# Patient Record
Sex: Female | Born: 1985 | Race: White | Hispanic: No | Marital: Married
Health system: Midwestern US, Community
[De-identification: ages and names within clinical notes are randomized; demographics above are authoritative.]

## PROBLEM LIST (undated history)

## (undated) DIAGNOSIS — R011 Cardiac murmur, unspecified: Secondary | ICD-10-CM

## (undated) HISTORY — PX: LAPAROSCOPY FOR ECTOPIC PREGNANCY: SUR765

## (undated) HISTORY — PX: TYMPANOSTOMY TUBE PLACEMENT: SHX32

---

## 2007-10-21 NOTE — Op Note (Signed)
[facility]                                OPERATION REPORT                         SURGEON:  Cyndie Chime, M.D.   [facility] [place], [place]   E:   MR  [id number]                DATE OF SURGERY:                     10/21/2007   #:   Lindley Magnus  [id number]             PT. LOCATION:                        OR  OR08   #   DERWIN Hubert Azure, M.D.         DOB: 07/04/1986        AGE:22        SEX:  F   cc:    Cyndie Chime, M.D.   PREOPERATIVE DIAGNOSIS:   Right ectopic pregnancy.   POSTOPERATIVE DIAGNOSIS:   Intraluminal rupture of a right tubal pregnancy.   PROCEDURE:   Laparoscopic right partial salpingectomy.   SURGEON:   Cyndie Chime, M.D.   SURGICAL ASSISTANT:   SA x 1   ANESTHESIA:General.   FINDINGS:   That of a normal uterus, normal tube and ovary on the left.  Normal ovary   on the right.  There was an ampullary intraluminal rupture of the right   tubal pregnancy with about 100 cc of hemoperitoneum.   DESCRIPTION OF OPERATIVE TECHNIQUE:  The patient was taken to the operating   room, placed on the operative table in the supine position and general   anesthesia was administered.  When an adequate level of anesthesia had been   reached, she was prepped and draped in the usual sterile manner.  A Cohen   cannula was placed in the cervix.  A 19-gauge Veress needle was introduced   in the umbilicus and 4 liters of CO2 were insufflated.  The short trocar   was introduced in the abdomen and the laparoscope was then inserted.  Once   inside, the findings consistent with the above were noted.  A 5 mm trocar   was placed in the suprapubic region under direct visualization and a 12 mm   trocar was placed midway between the umbilicus and the pubic bone.  The   EndoGIA was fired across the mesosalpinx involving the ectopic pregnancy   and the specimen was sent for pathological evaluation.  This was brought   out in a specimen bag through a 12 mm trocar insertion site.  The incision   was reinforced with vascular clips.   Assured of adequate hemostasis, the   CO2 was allowed to escape from the abdomen.  0.25% marcaine was then   injected into the subcutaneous tissues and a 4-0 Monocryl closed the skin   in subcuticular fashion.  The patient was then awakened and taken to   recovery room in stable condition having tolerated the procedure well.   Electronically Signed By:   Cyndie Chime, M.D. 11/14/2007 13:44   ____________________________   Cyndie Chime, M.D.   ECC  D:  10/21/2007  T:  10/24/2007  8:43 A   [id number]

## 2010-08-24 LAB — D-DIMER, QUANTITATIVE: D-Dimer, Quant: <0.22 ug/ml(FEU) (ref <0.46)

## 2010-08-25 NOTE — Procedures (Signed)
Test Reason : Dizziness and giddiness   Blood Pressure : ***/*** mmHG   Vent. Rate : 084 BPM     Atrial Rate : 084 BPM      P-R Int : 180 ms          QRS Dur : 100 ms       QT Int : 360 ms       P-R-T Axes : 059 067 052 degrees      QTc Int : 425 ms   Normal sinus rhythm   Incomplete right bundle branch block   Borderline ECG   No previous ECGs available   Confirmed by Ramon Dredge ([id number]) on 08/26/2010 7:09:12 AM   Referred By:             Gay Filler By: Ramon Dredge

## 2011-03-03 LAB — HIV-1 AB WESTERN BLOT CONFIRM ONLY: HIV, EXTERNAL: NEGATIVE

## 2011-03-03 LAB — RPR: RPR, EXTERNAL: NONREACTIVE

## 2011-03-03 LAB — BLOOD TYPE, (ABO+RH)

## 2011-07-07 LAB — TYPE AND SCREEN
ABO/Rh: B POS
Antibody Screen: NEGATIVE

## 2011-07-08 NOTE — Procedures (Signed)
Epidural Catheter Placement    Operator: Guss Bunde, MD      Risks, benefits, and alternatives discussed. Time out performed, correct patient and site identified. Standard monitors applied.  Adequate sedation provided. Patient placed in sitting position, back prepped and draped with Betadine. 1% lidocaine skin wheal, 17g Tuohy needle placed at L3-4    level. + LOR obtained with air/nss, no heme or CSF noted. Epidural catheter placed via Tuohy needle and secured at 11   cm at skin, no paresthesias. Negative test dose with 1.5% lidocaine w/1:200,000 epinephrine. Catheter dosed with Fentanyl 100 mcg and 0.2% Ropiv 12 ml.  No apparent complications.  See nurses notes for VS. Infusion started by RN

## 2011-07-08 NOTE — Procedures (Signed)
Epidural Catheter Placement    Operator: Guss Bunde, MD      Risks, benefits, and alternatives discussed. Time out performed, correct patient and site identified. Standard monitors applied.  Adequate sedation provided. Patient placed in sitting position, back prepped and draped with Betadine. 1% lidocaine skin wheal, 17g Tuohy needle placed at L3-4  level. + LOR obtained with air/nss, no heme or CSF noted. Epidural catheter placed via Tuohy needle and secured at 9 cm at skin, no paresthesias. Negative test dose with 3 mLs 1.5% lidocaine w/1:200,000 epinephrine. Catheter dosed with Fentanyl 100 mcg and 0.2% Ropiv 12 ml.  No apparent complications.  See nurses notes for VS. Infusion started by RN   Epidural Catheter Placement    Operator: Guss Bunde, MD      Risks, benefits, and alternatives discussed. Time out performed, correct patient and site identified. Standard monitors applied.  Adequate sedation provided. Patient placed in sitting position, back prepped and draped with Betadine. 1% lidocaine skin wheal, 17g Tuohy needle placed at L3-4  level. + LOR obtained with air/nss, no heme or CSF noted. Epidural catheter placed via Tuohy needle and secured at 9   cm at skin, no paresthesias. Negative test dose with 3 mLs 1.5% lidocaine w/1:200,000 epinephrine. Catheter dosed with Fentanyl 100 mcg and 0.2% Ropiv 12 ml.  No apparent complications.  See nurses notes for VS. Infusion started by RN

## 2014-04-14 NOTE — ED Notes (Signed)
Formatting of this note might be different from the original.  Pt left via wheelchair to US.  Electronically signed by Felton ClintonHoven, Joyce S at 04/14/2014  2:41 PM EDT

## 2014-04-14 NOTE — ED Notes (Signed)
Formatting of this note might be different from the original.  I have reviewed discharge instructions with the patient.  The patient verbalized understanding.  Patient received three presription(s). Patient told not to drive with medication. Patient was ambulatory upon discharge. Patient was in stable condition. Patient was accompanied with family member. Patient armband removed and shredded    Electronically signed by Felton Clinton at 04/14/2014  3:59 PM EDT

## 2014-04-14 NOTE — ED Notes (Signed)
Formatting of this note might be different from the original.  Pt ambulated to the bathroom. Gait steady. Urine obtained and sent to the lab.  Electronically signed by Felton Clinton at 04/14/2014  1:44 PM EDT

## 2014-04-14 NOTE — ED Notes (Signed)
Formatting of this note might be different from the original.  Pt reports abd pain x1 week along with nausea. Abd pain radiates to the back, worse during intercourse or when trunk twisting. Denies hematuria/dysutia/pyuria.   Electronically signed by Felton Clinton at 04/14/2014  1:42 PM EDT

## 2014-04-14 NOTE — ED Notes (Signed)
Formatting of this note might be different from the original.  Lower right abdominal pain and diarrhea x 1 week. Getting progressively worse. Abnormal period 7/26. Sharp pain with certain movement  Electronically signed by Loura Halt, RN at 04/14/2014  1:22 PM EDT

## 2014-04-14 NOTE — ED Provider Notes (Signed)
Associated Order(s): PELVIC EXAM  (ASAP ONLY)  Formatting of this note is different from the original.  HPI Comments: Michelle Mcintyre is a 28 year old white female who presents to the ER this date with a c/o right pelvic pain for the past week.   Pt rates her pain as a 8:10 and adds that its constant.  She has no Hx of STD or kidney stones.  She has had nausea without emesis.      Patient is a 28 y.o. female presenting with abdominal pain. The history is provided by the patient. History limited by: no communication barrier.   Abdominal Pain   This is a new problem. The current episode started more than 2 days ago (Pt states she's had this pain for a week. ). The problem occurs constantly. The problem has not changed since onset.Pain location: Right Pelvic area. The quality of the pain is cramping and aching. The pain is at a severity of 8/10. Associated symptoms include nausea. Pertinent negatives include no vomiting. The pain is worsened by activity. The pain is relieved by nothing.       Past Medical History   Diagnosis Date   ? Heart abnormalities        Past Surgical History   Procedure Laterality Date   ? Hx abdominal laparoscopy  2/09       History reviewed. No pertinent family history.     History     Social History   ? Marital Status: SINGLE     Spouse Name: N/A     Number of Children: N/A   ? Years of Education: N/A     Occupational History   ? Not on file.     Social History Main Topics   ? Smoking status: Former Smoker -- 1.00 packs/day for 7 years   ? Smokeless tobacco: Never Used   ? Alcohol Use: No   ? Drug Use: No   ? Sexual Activity:     Partners: Male     Birth Control/ Protection: Implant     Other Topics Concern   ? Not on file     Social History Narrative         ALLERGIES: Review of patient's allergies indicates no known allergies.    Review of Systems   Constitutional: Negative.    HENT: Negative.    Eyes: Negative.    Respiratory: Negative.    Cardiovascular: Negative.    Gastrointestinal:  Positive for nausea and abdominal pain. Negative for vomiting.   Endocrine: Negative.    Genitourinary: Positive for pelvic pain.   Musculoskeletal: Negative.    Skin: Negative.    Allergic/Immunologic: Negative.    Neurological: Negative.    Hematological: Negative.    Psychiatric/Behavioral: Negative.      Filed Vitals:    04/14/14 1324   BP: 132/89   Pulse: 89   Temp: 98.8 F (37.1 C)   Resp: 16   Height: 5\' 6"  (1.676 m)   Weight: 63.504 kg (140 lb)   SpO2: 100%       Physical Exam   Constitutional: She is oriented to person, place, and time. She appears well-developed and well-nourished. No distress.   HENT:   Head: Normocephalic and atraumatic.   Eyes: EOM are normal. Pupils are equal, round, and reactive to light.   Neck: Normal range of motion. Neck supple.   Cardiovascular: Normal rate, regular rhythm and normal heart sounds.    Pulmonary/Chest: Effort normal and breath sounds  normal. No respiratory distress. She has no wheezes. She has no rales.   Abdominal: Soft. Bowel sounds are normal. She exhibits no distension. There is no tenderness. There is no rebound and no guarding.   Genitourinary:   See procedure note    Musculoskeletal: Normal range of motion.   Neurological: She is alert and oriented to person, place, and time. No cranial nerve deficit. She exhibits normal muscle tone. Coordination normal.   Skin: Skin is warm and dry.   Psychiatric: She has a normal mood and affect.   Nursing note and vitals reviewed.      MDM  Number of Diagnoses or Management Options  BV (bacterial vaginosis):   Cyst of right ovary:   Diagnosis management comments: DDX:  Ovaria Cyst, Ovarian Presgnancy, Ovarian Torsion.  Katherine Roan, NP  3:48 PM      Amount and/or Complexity of Data Reviewed  Clinical lab tests: ordered and reviewed  Tests in the radiology section of CPT: ordered and reviewed    Risk of Complications, Morbidity, and/or Mortality  Presenting problems: moderate  Diagnostic procedures: moderate  Management  options: moderate    Patient Progress  Patient progress: stable    Pelvic Exam  Date/Time: 04/14/2014 2:42 PM  Performed by: NP  Procedure duration:  4 minutes.  Documented by:  Seward Meth NP-C.   Exam assisted by:  Sheralyn Boatman  RN.  Type of exam performed: speculum.    External genitalia appearance: normal.    Vaginal exam:  normal.    Cervical exam:  discharge from cervix.    Specimen(s) collected:  chlamydia, GC and vaginal culture.  Bimanual exam:  normal.    Patient tolerance: Patient tolerated the procedure well with no immediate complications    Diagnosis:   1. Cyst of right ovary    2. BV (bacterial vaginosis)      Disposition:   Discharged to home.      Follow-up Information    Follow up With Details Comments Contact Info    Newman Pies, MD  on Monday to obtain a follow up appointment.   [address]  [address]   [facility]  [place] [address]  [phone]        Current Discharge Medication List     START taking these medications    Details   oxyCODONE-acetaminophen (PERCOCET) 5-325 mg per tablet Take 1 Tab by mouth every six (6) hours as needed for Pain. Max Daily Amount: 4 Tabs.  Qty: 20 Tab, Refills: 0     ibuprofen (MOTRIN) 600 mg tablet Take 1 Tab by mouth every six (6) hours as needed for Pain.  Qty: 20 Tab, Refills: 0     metroNIDAZOLE (FLAGYL) 500 mg tablet Take 1 Tab by mouth two (2) times a day for 10 days.  Qty: 20 Tab, Refills: 0           Electronically signed by Sherrian Divers, MD at 04/14/2014  4:44 PM EDT    Associated attestation - Sherrian Divers, MD - 04/14/2014  4:44 PM EDT  Formatting of this note might be different from the original.  Patient with pelvic pain c/w ovarian cyst without evidence of STD, PID, torsion, UTI or pregnancy. Patient understands that an early or occult intraabdominal or pelvic fracture cannot be ruled out and will return immediately for any worsening or failure to respond to symptomatic treatment over the next 1-2 days. Precautions given.    I have  personally  seen and evaluated patient. I find the patient's history and physical exam are consistent with the PA's NP documentation. I agree with the care provided, treatments rendered, disposition and follow up plan.

## 2016-05-25 ENCOUNTER — Emergency Department (HOSPITAL_COMMUNITY)
Admission: EM | Admit: 2016-05-25 | Discharge: 2016-05-25 | Disposition: A | Discharge status: Home / Self Care | Payer: Self-pay | Attending: Emergency Medicine | Admitting: Emergency Medicine

## 2016-05-25 ENCOUNTER — Emergency Department (HOSPITAL_COMMUNITY): Payer: Self-pay

## 2016-05-25 ENCOUNTER — Encounter (HOSPITAL_COMMUNITY): Payer: Self-pay | Admitting: Emergency Medicine

## 2016-05-25 DIAGNOSIS — N760 Acute vaginitis: Secondary | ICD-10-CM | POA: Insufficient documentation

## 2016-05-25 DIAGNOSIS — Z791 Long term (current) use of non-steroidal anti-inflammatories (NSAID): Secondary | ICD-10-CM | POA: Insufficient documentation

## 2016-05-25 DIAGNOSIS — Z79899 Other long term (current) drug therapy: Secondary | ICD-10-CM | POA: Insufficient documentation

## 2016-05-25 DIAGNOSIS — Z113 Encounter for screening for infections with a predominantly sexual mode of transmission: Secondary | ICD-10-CM | POA: Insufficient documentation

## 2016-05-25 DIAGNOSIS — R1031 Right lower quadrant pain: Secondary | ICD-10-CM

## 2016-05-25 DIAGNOSIS — Z711 Person with feared health complaint in whom no diagnosis is made: Secondary | ICD-10-CM

## 2016-05-25 DIAGNOSIS — B9689 Other specified bacterial agents as the cause of diseases classified elsewhere: Secondary | ICD-10-CM

## 2016-05-25 DIAGNOSIS — N83201 Unspecified ovarian cyst, right side: Secondary | ICD-10-CM

## 2016-05-25 LAB — CBC WITH DIFFERENTIAL/PLATELET
BASOS ABS: 0 10*3/uL (ref 0.0–0.1)
BASOS PCT: 0 %
EOS PCT: 0 %
Eosinophils Absolute: 0 10*3/uL (ref 0.0–0.7)
HEMATOCRIT: 39.2 % (ref 36.0–46.0)
Hemoglobin: 12.8 g/dL (ref 12.0–15.0)
LYMPHS PCT: 25 %
Lymphs Abs: 2.2 10*3/uL (ref 0.7–4.0)
MCH: 27.9 pg (ref 26.0–34.0)
MCHC: 32.7 g/dL (ref 30.0–36.0)
MCV: 85.6 fL (ref 78.0–100.0)
MONO ABS: 0.6 10*3/uL (ref 0.1–1.0)
Monocytes Relative: 7 %
NEUTROS ABS: 5.9 10*3/uL (ref 1.7–7.7)
NEUTROS PCT: 68 %
PLATELETS: 213 10*3/uL (ref 150–400)
RBC: 4.58 MIL/uL (ref 3.87–5.11)
RDW: 13.9 % (ref 11.5–15.5)
WBC: 8.7 10*3/uL (ref 4.0–10.5)

## 2016-05-25 LAB — COMPREHENSIVE METABOLIC PANEL
ALBUMIN: 3.9 g/dL (ref 3.5–5.0)
ALK PHOS: 57 U/L (ref 38–126)
ALT: 13 U/L — ABNORMAL LOW (ref 14–54)
ANION GAP: 5 (ref 5–15)
AST: 21 U/L (ref 15–41)
BUN: 11 mg/dL (ref 6–20)
CALCIUM: 9.5 mg/dL (ref 8.9–10.3)
CO2: 29 mmol/L (ref 22–32)
Chloride: 104 mmol/L (ref 101–111)
Creatinine, Ser: 0.71 mg/dL (ref 0.44–1.00)
GFR calc non Af Amer: >60 mL/min (ref >60)
GLUCOSE: 90 mg/dL (ref 65–99)
POTASSIUM: 4.1 mmol/L (ref 3.5–5.1)
SODIUM: 138 mmol/L (ref 135–145)
Total Bilirubin: 0.5 mg/dL (ref 0.3–1.2)
Total Protein: 7.1 g/dL (ref 6.5–8.1)

## 2016-05-25 LAB — POC URINE PREG, ED: Preg Test, Ur: NEGATIVE

## 2016-05-25 LAB — URINALYSIS, ROUTINE W REFLEX MICROSCOPIC
BILIRUBIN URINE: NEGATIVE
Glucose, UA: NEGATIVE mg/dL
Hgb urine dipstick: NEGATIVE
KETONES UR: NEGATIVE mg/dL
Leukocytes, UA: NEGATIVE
NITRITE: NEGATIVE
Protein, ur: NEGATIVE mg/dL
Specific Gravity, Urine: 1.021 (ref 1.005–1.030)
pH: 6.5 (ref 5.0–8.0)

## 2016-05-25 LAB — WET PREP, GENITAL
Sperm: NONE SEEN
TRICH WET PREP: NONE SEEN
YEAST WET PREP: NONE SEEN

## 2016-05-25 MED ORDER — NAPROXEN 500 MG PO TABS: 500.0000 mg | ORAL_TABLET | Freq: Two times a day (BID) | ORAL | 0 refills | Status: DC

## 2016-05-25 MED ORDER — AZITHROMYCIN 250 MG PO TABS
1000.0000 mg | ORAL_TABLET | Freq: Once | ORAL | Status: AC
  Administered 2016-05-25: 1000 mg via ORAL
  Filled 2016-05-25: qty 4

## 2016-05-25 MED ORDER — LIDOCAINE HCL 1 % IJ SOLN
INTRAMUSCULAR | Status: AC
  Administered 2016-05-25: 20 mL
  Filled 2016-05-25: qty 20

## 2016-05-25 MED ORDER — CEFTRIAXONE SODIUM 250 MG IJ SOLR
250.0000 mg | Freq: Once | INTRAMUSCULAR | Status: AC
  Administered 2016-05-25: 250 mg via INTRAMUSCULAR
  Filled 2016-05-25: qty 250

## 2016-05-25 MED ORDER — METRONIDAZOLE 500 MG PO TABS: 500.0000 mg | ORAL_TABLET | Freq: Two times a day (BID) | ORAL | 0 refills | Status: DC

## 2016-05-25 MED ORDER — ACETAMINOPHEN 325 MG PO TABS
650.0000 mg | ORAL_TABLET | Freq: Once | ORAL | Status: AC
  Administered 2016-05-25: 650 mg via ORAL
  Filled 2016-05-25: qty 2

## 2016-05-25 NOTE — ED Notes (Signed)
Patient transported to US 

## 2016-05-25 NOTE — ED Provider Notes (Signed)
[facility]-EMERGENCY DEPT Provider Note   CSN: [id number] Arrival date & time: 05/25/16  1054     History   Chief Complaint Chief Complaint  Patient presents with  . Abdominal Pain  . Back Pain    HPI Andrea Riddle is a 30 y.o. female.  Andrea Riddle is a 30 y.o. Female who presents to the emergency department complaining of cramping right lower quadrant abdominal pain for the past 5 days. She reports gradual onset about 5 days ago. She reports her pain is cramping and is worse with palpation. She reports it feels like where her ovary is. She reports seeing some ibuprofen early this morning with some relief. She denies any nausea, vomiting or diarrhea. She does report some slight vaginal discharge. She is sexually active and does not use protection. Last menstrual cycle was 04/28/2016. Previous abdominal surgical history includes a salpingectomy on her left side after an ectopic pregnancy. Patient denies fevers, nausea, vomiting, diarrhea, dysuria, urinary symptoms, upper abdominal pain, coughing or rashes.    The history is provided by the patient. No language interpreter was used.  Abdominal Pain   Pertinent negatives include fever, diarrhea, nausea, vomiting, dysuria, frequency and headaches.  Back Pain   Associated symptoms include abdominal pain and pelvic pain. Pertinent negatives include no chest pain, no fever, no headaches and no dysuria.    History reviewed. No pertinent past medical history.  There are no active problems to display for this patient.   History reviewed. No pertinent surgical history.  OB History    No data available       Home Medications    Prior to Admission medications   Medication Sig Start Date End Date Taking? Authorizing Provider  Acetaminophen-Aspirin Buffered (EXCEDRIN BACK & BODY) 250-250 MG tablet Take 2 tablets by mouth every 4 (four) hours as needed for fever or pain.   Yes Historical Provider, MD  ibuprofen (ADVIL,MOTRIN) 800 MG tablet  Take 800 mg by mouth every 6 (six) hours as needed for fever, headache, mild pain, moderate pain or cramping.   Yes Historical Provider, MD  Melatonin 5 MG TABS Take 10 mg by mouth at bedtime.   Yes Historical Provider, MD  metroNIDAZOLE (FLAGYL) 500 MG tablet Take 1 tablet (500 mg total) by mouth 2 (two) times daily. 05/25/16   Everlene Farrier, PA-C  naproxen (NAPROSYN) 500 MG tablet Take 1 tablet (500 mg total) by mouth 2 (two) times daily with a meal. 05/25/16   Everlene Farrier, PA-C    Family History No family history on file.  Social History Social History  Substance Use Topics  . Smoking status: Never Smoker  . Smokeless tobacco: Not on file  . Alcohol use No     Allergies   Review of patient's allergies indicates no known allergies.   Review of Systems Review of Systems  Constitutional: Negative for chills and fever.  HENT: Negative for congestion and sore throat.   Eyes: Negative for visual disturbance.  Respiratory: Negative for cough and shortness of breath.   Cardiovascular: Negative for chest pain.  Gastrointestinal: Positive for abdominal pain. Negative for blood in stool, diarrhea, nausea and vomiting.  Genitourinary: Positive for pelvic pain and vaginal discharge. Negative for difficulty urinating, dysuria, frequency, genital sores, urgency and vaginal bleeding.  Musculoskeletal: Negative for neck pain.  Skin: Negative for rash.  Neurological: Negative for headaches.     Physical Exam Updated Vital Signs BP 142/92 (BP Location: Left Arm)   Pulse 76   Temp 98.2  F (36.8 C) (Oral)   Resp 16   Ht 5\' 6"  (1.676 m)   Wt 88.9 kg   LMP 04/28/2016   SpO2 99%   BMI 31.64 kg/m   Physical Exam  Constitutional: She appears well-developed and well-nourished. No distress.  Nontoxic appearing.  HENT:  Head: Normocephalic and atraumatic.  Mouth/Throat: Oropharynx is clear and moist.  Eyes: Conjunctivae are normal. Pupils are equal, round, and reactive to light.  Right eye exhibits no discharge. Left eye exhibits no discharge.  Neck: Neck supple.  Cardiovascular: Normal rate, regular rhythm, normal heart sounds and intact distal pulses.   Pulmonary/Chest: Effort normal and breath sounds normal. No respiratory distress. She has no wheezes. She has no rales.  Abdominal: Soft. Bowel sounds are normal. She exhibits no distension and no mass. There is tenderness. There is no rebound and no guarding.  Abdomen is soft. Bowel sounds are present. Patient has right adnexal tenderness or palpation. No rebound tenderness. No psoas or obturator sign. No CVA or flank tenderness.  Musculoskeletal: She exhibits no edema.  Lymphadenopathy:    She has no cervical adenopathy.  Neurological: She is alert. Coordination normal.  Skin: Skin is warm and dry. Capillary refill takes less than 2 seconds. No rash noted. She is not diaphoretic. No erythema. No pallor.  Psychiatric: She has a normal mood and affect. Her behavior is normal.  Nursing note and vitals reviewed.    ED Treatments / Results  Labs (all labs ordered are listed, but only abnormal results are displayed) Labs Reviewed  WET PREP, GENITAL - Abnormal; Notable for the following:       Result Value   Clue Cells Wet Prep HPF POC PRESENT (*)    WBC, Wet Prep HPF POC MANY (*)    All other components within normal limits  COMPREHENSIVE METABOLIC PANEL - Abnormal; Notable for the following:    ALT 13 (*)    All other components within normal limits  URINALYSIS, ROUTINE W REFLEX MICROSCOPIC (NOT AT [facility]) - Abnormal; Notable for the following:    APPearance CLOUDY (*)    All other components within normal limits  CBC WITH DIFFERENTIAL/PLATELET  POC URINE PREG, ED  GC/CHLAMYDIA PROBE AMP ([place]) NOT AT [facility]    EKG  EKG Interpretation None       Radiology US Transvaginal Non-ob  Result Date: 05/25/2016 CLINICAL DATA:  Right pelvic pain for 2 weeks.  No known injury. EXAM: TRANSABDOMINAL AND  TRANSVAGINAL ULTRASOUND OF PELVIS DOPPLER ULTRASOUND OF OVARIES TECHNIQUE: Both transabdominal and transvaginal ultrasound examinations of the pelvis were performed. Transabdominal technique was performed for global imaging of the pelvis including uterus, ovaries, adnexal regions, and pelvic cul-de-sac. It was necessary to proceed with endovaginal exam following the transabdominal exam to visualize the ovaries. Color and duplex Doppler ultrasound was utilized to evaluate blood flow to the ovaries. COMPARISON:  None. FINDINGS: Uterus Measurements: 8.6 x 4.5 x 4.8 cm. No fibroids or other mass visualized. Endometrium Thickness: 17 mm.  No focal abnormality visualized. Right ovary Measurements: 4.1 x 2.5 x 3.2 cm. Small cystic lesion with internal echoes measures 2.4 x 1.8 x 2.0 cm and has an appearance most consistent with a hemorrhagic cyst. Left ovary Measurements: 3.0 x 1.6 x 2.2 cm. Normal appearance/no adnexal mass. Pulsed Doppler evaluation of both ovaries demonstrates normal low-resistance arterial and venous waveforms. Other findings Trace amount of free pelvic fluid noted. IMPRESSION: Small hemorrhagic right ovarian cyst. The examination is otherwise negative. Electronically Signed  By: Drusilla Kanner M.D.   On: 05/25/2016 14:44   US Pelvis Complete  Result Date: 05/25/2016 CLINICAL DATA:  Right pelvic pain for 2 weeks.  No known injury. EXAM: TRANSABDOMINAL AND TRANSVAGINAL ULTRASOUND OF PELVIS DOPPLER ULTRASOUND OF OVARIES TECHNIQUE: Both transabdominal and transvaginal ultrasound examinations of the pelvis were performed. Transabdominal technique was performed for global imaging of the pelvis including uterus, ovaries, adnexal regions, and pelvic cul-de-sac. It was necessary to proceed with endovaginal exam following the transabdominal exam to visualize the ovaries. Color and duplex Doppler ultrasound was utilized to evaluate blood flow to the ovaries. COMPARISON:  None. FINDINGS: Uterus  Measurements: 8.6 x 4.5 x 4.8 cm. No fibroids or other mass visualized. Endometrium Thickness: 17 mm.  No focal abnormality visualized. Right ovary Measurements: 4.1 x 2.5 x 3.2 cm. Small cystic lesion with internal echoes measures 2.4 x 1.8 x 2.0 cm and has an appearance most consistent with a hemorrhagic cyst. Left ovary Measurements: 3.0 x 1.6 x 2.2 cm. Normal appearance/no adnexal mass. Pulsed Doppler evaluation of both ovaries demonstrates normal low-resistance arterial and venous waveforms. Other findings Trace amount of free pelvic fluid noted. IMPRESSION: Small hemorrhagic right ovarian cyst. The examination is otherwise negative. Electronically Signed   By: Drusilla Kanner M.D.   On: 05/25/2016 14:44   [place] Art/ven Flow Abd Pelv Doppler  Result Date: 05/25/2016 CLINICAL DATA:  Right pelvic pain for 2 weeks.  No known injury. EXAM: TRANSABDOMINAL AND TRANSVAGINAL ULTRASOUND OF PELVIS DOPPLER ULTRASOUND OF OVARIES TECHNIQUE: Both transabdominal and transvaginal ultrasound examinations of the pelvis were performed. Transabdominal technique was performed for global imaging of the pelvis including uterus, ovaries, adnexal regions, and pelvic cul-de-sac. It was necessary to proceed with endovaginal exam following the transabdominal exam to visualize the ovaries. Color and duplex Doppler ultrasound was utilized to evaluate blood flow to the ovaries. COMPARISON:  None. FINDINGS: Uterus Measurements: 8.6 x 4.5 x 4.8 cm. No fibroids or other mass visualized. Endometrium Thickness: 17 mm.  No focal abnormality visualized. Right ovary Measurements: 4.1 x 2.5 x 3.2 cm. Small cystic lesion with internal echoes measures 2.4 x 1.8 x 2.0 cm and has an appearance most consistent with a hemorrhagic cyst. Left ovary Measurements: 3.0 x 1.6 x 2.2 cm. Normal appearance/no adnexal mass. Pulsed Doppler evaluation of both ovaries demonstrates normal low-resistance arterial and venous waveforms. Other findings Trace amount of  free pelvic fluid noted. IMPRESSION: Small hemorrhagic right ovarian cyst. The examination is otherwise negative. Electronically Signed   By: Drusilla Kanner M.D.   On: 05/25/2016 14:44    Procedures Procedures (including critical care time)  Medications Ordered in ED Medications  acetaminophen (TYLENOL) tablet 650 mg (650 mg Oral Given 05/25/16 1346)  cefTRIAXone (ROCEPHIN) injection 250 mg (250 mg Intramuscular Given 05/25/16 1506)  azithromycin (ZITHROMAX) tablet 1,000 mg (1,000 mg Oral Given 05/25/16 1506)  lidocaine (XYLOCAINE) 1 % (with pres) injection (20 mLs  Given 05/25/16 1506)     Initial Impression / Assessment and Plan / ED Course  I have reviewed the triage vital signs and the nursing notes.  Pertinent labs & imaging results that were available during my care of the patient were reviewed by me and considered in my medical decision making (see chart for details).  Clinical Course   Patient presented to the emergency department complaining of right lower quadrant abdominal pain and vaginal discharge for 5 days. She reports gradual onset of pain. No fevers. No urinary symptoms. On exam the patient is afebrile  nontoxic-appearing. She has right adnexal tenderness on exam. On pelvic exam she has white vaginal discharge and right adnexal tenderness. No cervical motion tenderness. Cervix is closed. No vaginal bleeding. CMP and CBC are unremarkable. Pregnancy test is negative. Urinalysis showed no sign of infection. Wet prep is positive for clue cells and many white blood cells. Pelvic ultrasound was obtained. It shows a right hemorrhagic cyst. No evidence of torsion. Ultrasound is otherwise unremarkable. Recheck patient reports she is feeling better. She is tolerating by mouth. I advised her of these results. We'll treat her for gonorrhea and chlamydia with Rocephin and azithromycin. I advised that these tests are pending and she should follow-up in 3 days for test results. We'll also  discharge her with naproxen for pain control. I encouraged her to follow-up with the health department or with her OB/GYN for routine STD check in 1 week. I discussed return precautions. I advised the patient to follow-up with their primary care provider this week. I advised the patient to return to the emergency department with new or worsening symptoms or new concerns. The patient verbalized understanding and agreement with plan.    Final Clinical Impressions(s) / ED Diagnoses   Final diagnoses:  RLQ abdominal pain  Right ovarian cyst  Concern about STD in female without diagnosis  BV (bacterial vaginosis)    New Prescriptions New Prescriptions   METRONIDAZOLE (FLAGYL) 500 MG TABLET    Take 1 tablet (500 mg total) by mouth 2 (two) times daily.   NAPROXEN (NAPROSYN) 500 MG TABLET    Take 1 tablet (500 mg total) by mouth 2 (two) times daily with a meal.     Everlene FarrierWilliam Donyea Gafford, PA-C 05/25/16 1514    Gwyneth SproutWhitney Plunkett, MD 05/29/16 [phone]

## 2016-05-25 NOTE — ED Triage Notes (Signed)
Pt c/o lower right sided abdominal pain, pt describes it as "cramping feeling in my ovaries and lower back, I get bacterial vaginosis a lot." Pain onset about 5 days ago last took 800 mg ibuprofen about 6 am today. Denies any burning while voiding, denies nausea or vomiting.

## 2016-05-26 LAB — GC/CHLAMYDIA PROBE AMP (~~LOC~~) NOT AT ARMC
CHLAMYDIA, DNA PROBE: NEGATIVE
NEISSERIA GONORRHEA: NEGATIVE

## 2016-11-19 ENCOUNTER — Encounter (HOSPITAL_COMMUNITY): Payer: Self-pay | Admitting: Emergency Medicine

## 2016-11-19 ENCOUNTER — Emergency Department (HOSPITAL_COMMUNITY)
Admission: EM | Admit: 2016-11-19 | Discharge: 2016-11-19 | Disposition: A | Discharge status: Home / Self Care | Payer: Self-pay | Attending: Emergency Medicine | Admitting: Emergency Medicine

## 2016-11-19 DIAGNOSIS — Z202 Contact with and (suspected) exposure to infections with a predominantly sexual mode of transmission: Secondary | ICD-10-CM

## 2016-11-19 DIAGNOSIS — N760 Acute vaginitis: Secondary | ICD-10-CM | POA: Insufficient documentation

## 2016-11-19 DIAGNOSIS — Z791 Long term (current) use of non-steroidal anti-inflammatories (NSAID): Secondary | ICD-10-CM | POA: Insufficient documentation

## 2016-11-19 DIAGNOSIS — B9689 Other specified bacterial agents as the cause of diseases classified elsewhere: Secondary | ICD-10-CM

## 2016-11-19 DIAGNOSIS — Z79899 Other long term (current) drug therapy: Secondary | ICD-10-CM | POA: Insufficient documentation

## 2016-11-19 LAB — URINALYSIS, ROUTINE W REFLEX MICROSCOPIC
Bilirubin Urine: NEGATIVE
Glucose, UA: NEGATIVE mg/dL
Hgb urine dipstick: NEGATIVE
Ketones, ur: NEGATIVE mg/dL
LEUKOCYTES UA: NEGATIVE
NITRITE: NEGATIVE
PROTEIN: NEGATIVE mg/dL
SPECIFIC GRAVITY, URINE: 1.01 (ref 1.005–1.030)
pH: 6 (ref 5.0–8.0)

## 2016-11-19 LAB — WET PREP, GENITAL
Sperm: NONE SEEN
Trich, Wet Prep: NONE SEEN
Yeast Wet Prep HPF POC: NONE SEEN

## 2016-11-19 LAB — PREGNANCY, URINE: PREG TEST UR: NEGATIVE

## 2016-11-19 MED ORDER — LIDOCAINE HCL (PF) 1 % IJ SOLN
INTRAMUSCULAR | Status: AC
  Filled 2016-11-19: qty 5

## 2016-11-19 MED ORDER — CEFTRIAXONE SODIUM 250 MG IJ SOLR
250.0000 mg | Freq: Once | INTRAMUSCULAR | Status: AC
Start: 2016-11-19 — End: 2016-11-19
  Administered 2016-11-19: 250 mg via INTRAMUSCULAR
  Filled 2016-11-19: qty 250

## 2016-11-19 MED ORDER — METRONIDAZOLE 500 MG PO TABS: 500.0000 mg | ORAL_TABLET | Freq: Two times a day (BID) | ORAL | 0 refills | Status: DC

## 2016-11-19 MED ORDER — AZITHROMYCIN 250 MG PO TABS
1000.0000 mg | ORAL_TABLET | Freq: Once | ORAL | Status: AC
  Administered 2016-11-19: 1000 mg via ORAL
  Filled 2016-11-19: qty 4

## 2016-11-19 NOTE — Discharge Instructions (Signed)
Medications: Flagyl  Treatment: Take Flagyl twice daily for 1 week. Make sure not to drink alcohol with this medication. You will be called in 2-3 days if any of your results return positive. You have are being treated for gonorrhea and Chlamydia in the emergency department today. Make sure to tell all of your sexual partners of any positive results that can be treated as well.  Follow-up: You can follow-up with the health department in the future for STD testing. Please return to emergency department if he develop any new or worsening symptoms. Please follow-up and establish care with a primary care provider by calling the number circled on your discharge paperwork.

## 2016-11-19 NOTE — ED Triage Notes (Signed)
PT c/o vaginal odor with some discharge but no pain unrelieved by OTC yeast infection medications. PT denies any urinary symptoms.

## 2016-11-19 NOTE — ED Provider Notes (Signed)
[facility]-EMERGENCY DEPT Provider Note   CSN: [id number] Arrival date & time: 11/19/16  1101     History   Chief Complaint Chief Complaint  Patient presents with  . Vaginal Discharge    HPI Andrea Riddle is a 31 y.o. female with history of bacterial vaginosis who presents with a four-day history of malodorous vaginal discharge. Patient reports change in sexual partners and is concerned about STD exposure. Patient has had some mild associated intermittent nausea, but not significant. Patient has taken over-the-counter medication for yeast infection without relief. Patient denies any urinary symptoms, abnormal bleeding, pelvic pain, abdominal pain, vomiting, fevers. Patient LMP 11/07/2016.  HPI  History reviewed. No pertinent past medical history.  There are no active problems to display for this patient.   Past Surgical History:  Procedure Laterality Date  . LAPAROSCOPY FOR ECTOPIC PREGNANCY    . TYMPANOSTOMY TUBE PLACEMENT      OB History    Gravida Para Term Preterm AB Living   4       1 3    SAB TAB Ectopic Multiple Live Births       1           Home Medications    Prior to Admission medications   Medication Sig Start Date End Date Taking? Authorizing Provider  Acetaminophen-Aspirin Buffered (EXCEDRIN BACK & BODY) 250-250 MG tablet Take 2 tablets by mouth every 4 (four) hours as needed for fever or pain.    Historical Provider, MD  ibuprofen (ADVIL,MOTRIN) 800 MG tablet Take 800 mg by mouth every 6 (six) hours as needed for fever, headache, mild pain, moderate pain or cramping.    Historical Provider, MD  Melatonin 5 MG TABS Take 10 mg by mouth at bedtime.    Historical Provider, MD  metroNIDAZOLE (FLAGYL) 500 MG tablet Take 1 tablet (500 mg total) by mouth 2 (two) times daily. 11/19/16   Emi HolesAlexandra M Vester Balthazor, PA-C  naproxen (NAPROSYN) 500 MG tablet Take 1 tablet (500 mg total) by mouth 2 (two) times daily with a meal. 05/25/16   Everlene FarrierWilliam Dansie, PA-C    Family History History  reviewed. No pertinent family history.  Social History Social History  Substance Use Topics  . Smoking status: Never Smoker  . Smokeless tobacco: Never Used  . Alcohol use No     Allergies   Patient has no known allergies.   Review of Systems Review of Systems  Constitutional: Negative for fever.  Gastrointestinal: Positive for nausea. Negative for diarrhea and vomiting.  Genitourinary: Positive for vaginal discharge. Negative for dysuria, frequency, menstrual problem, pelvic pain, urgency, vaginal bleeding and vaginal pain.  Skin: Negative for rash and wound.     Physical Exam Updated Vital Signs BP 123/80 (BP Location: Right Arm)   Pulse 60   Temp 98.2 F (36.8 C) (Oral)   Resp 18   Ht 5\' 6"  (1.676 m)   Wt 68 kg   LMP 11/07/2016   SpO2 100%   BMI 24.21 kg/m   Physical Exam  Constitutional: She appears well-developed and well-nourished. No distress.  HENT:  Head: Normocephalic and atraumatic.  Mouth/Throat: Oropharynx is clear and moist. No oropharyngeal exudate.  Eyes: Conjunctivae are normal. Pupils are equal, round, and reactive to light. Right eye exhibits no discharge. Left eye exhibits no discharge. No scleral icterus.  Neck: Normal range of motion. Neck supple. No thyromegaly present.  Cardiovascular: Normal rate, regular rhythm, normal heart sounds and intact distal pulses.  Exam reveals no gallop and no  friction rub.   No murmur heard. Pulmonary/Chest: Effort normal and breath sounds normal. No stridor. No respiratory distress. She has no wheezes. She has no rales.  Abdominal: Soft. Bowel sounds are normal. She exhibits no distension. There is no tenderness. There is no rebound and no guarding.  Genitourinary: Uterus normal. Pelvic exam was performed with patient supine. Cervix exhibits discharge (white, milky). Cervix exhibits no motion tenderness. Right adnexum displays no mass, no tenderness and no fullness. Left adnexum displays no mass, no tenderness  and no fullness. No bleeding in the vagina. Vaginal discharge found.  Genitourinary Comments: Chaperone present  Musculoskeletal: She exhibits no edema.  Lymphadenopathy:    She has no cervical adenopathy.  Neurological: She is alert. Coordination normal.  Skin: Skin is warm and dry. No rash noted. She is not diaphoretic. No pallor.  Psychiatric: She has a normal mood and affect.  Nursing note and vitals reviewed.    ED Treatments / Results  Labs (all labs ordered are listed, but only abnormal results are displayed) Labs Reviewed  WET PREP, GENITAL - Abnormal; Notable for the following:       Result Value   Clue Cells Wet Prep HPF POC PRESENT (*)    WBC, Wet Prep HPF POC MANY (*)    All other components within normal limits  PREGNANCY, URINE  URINALYSIS, ROUTINE W REFLEX MICROSCOPIC  GC/CHLAMYDIA PROBE AMP ([place]) NOT AT [facility]    EKG  EKG Interpretation None       Radiology No results found.  Procedures Procedures (including critical care time)  Medications Ordered in ED Medications  lidocaine (PF) (XYLOCAINE) 1 % injection (not administered)  cefTRIAXone (ROCEPHIN) injection 250 mg (250 mg Intramuscular Given 11/19/16 1408)  azithromycin (ZITHROMAX) tablet 1,000 mg (1,000 mg Oral Given 11/19/16 1406)     Initial Impression / Assessment and Plan / ED Course  I have reviewed the triage vital signs and the nursing notes.  Pertinent labs & imaging results that were available during my care of the patient were reviewed by me and considered in my medical decision making (see chart for details).     Patient with vaginal discharge, no concern for PID. Patient treated in the ED for STI with Rocephin, azithromycin. Discharge home with Flagyl for bacterial vaginosis. Patient advised not to drink alcohol with this medication. Patient advised to inform and treat all sexual partners.  Pt advised on safe sex practices and understands that they have GC/Chlamydia cultures  pending and will result in 2-3 days. Patient declined HIV and RPR at this time. UA negative. Urine pregnancy negative. Pt encouraged to follow up at local health department for future STI checks. Discussed return precautions. Patient understands and agrees with plan. Patient vitals stable throughout ED course and discharged in satisfactory condition.   Final Clinical Impressions(s) / ED Diagnoses   Final diagnoses:  BV (bacterial vaginosis)  Possible exposure to STD    New Prescriptions Discharge Medication List as of 11/19/2016  2:30 PM       Emi Holes, PA-C 11/19/16 1736    Vanetta Mulders, MD 11/21/16 [phone]

## 2016-11-23 LAB — GC/CHLAMYDIA PROBE AMP (~~LOC~~) NOT AT ARMC
Chlamydia: NEGATIVE
Neisseria Gonorrhea: NEGATIVE

## 2017-01-26 ENCOUNTER — Emergency Department (HOSPITAL_COMMUNITY)
Admission: EM | Admit: 2017-01-26 | Discharge: 2017-01-26 | Disposition: A | Discharge status: Home / Self Care | Payer: Self-pay | Attending: Emergency Medicine | Admitting: Emergency Medicine

## 2017-01-26 ENCOUNTER — Encounter (HOSPITAL_COMMUNITY): Payer: Self-pay | Admitting: *Deleted

## 2017-01-26 ENCOUNTER — Emergency Department (HOSPITAL_COMMUNITY): Payer: Self-pay

## 2017-01-26 DIAGNOSIS — S060X1A Concussion with loss of consciousness of 30 minutes or less, initial encounter: Secondary | ICD-10-CM | POA: Insufficient documentation

## 2017-01-26 DIAGNOSIS — M542 Cervicalgia: Secondary | ICD-10-CM | POA: Insufficient documentation

## 2017-01-26 DIAGNOSIS — S0990XA Unspecified injury of head, initial encounter: Secondary | ICD-10-CM

## 2017-01-26 DIAGNOSIS — M25512 Pain in left shoulder: Secondary | ICD-10-CM | POA: Insufficient documentation

## 2017-01-26 DIAGNOSIS — Y999 Unspecified external cause status: Secondary | ICD-10-CM | POA: Insufficient documentation

## 2017-01-26 DIAGNOSIS — Y939 Activity, unspecified: Secondary | ICD-10-CM | POA: Insufficient documentation

## 2017-01-26 DIAGNOSIS — Y92009 Unspecified place in unspecified non-institutional (private) residence as the place of occurrence of the external cause: Secondary | ICD-10-CM | POA: Insufficient documentation

## 2017-01-26 HISTORY — DX: Cardiac murmur, unspecified: R01.1

## 2017-01-26 MED ORDER — IBUPROFEN 800 MG PO TABS
800.0000 mg | ORAL_TABLET | Freq: Once | ORAL | Status: AC
  Administered 2017-01-26: 800 mg via ORAL
  Filled 2017-01-26: qty 1

## 2017-01-26 MED ORDER — IBUPROFEN 800 MG PO TABS: 800.0000 mg | ORAL_TABLET | Freq: Three times a day (TID) | ORAL | 0 refills | Status: AC

## 2017-01-26 MED ORDER — METHOCARBAMOL 500 MG PO TABS: 500.0000 mg | ORAL_TABLET | Freq: Two times a day (BID) | ORAL | 0 refills | Status: AC

## 2017-01-26 NOTE — ED Notes (Signed)
Pt ambulatory to waiting room. Pt verbalized understanding of discharge instructions.   
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
## 2017-01-26 NOTE — Discharge Instructions (Signed)
Your xrays look normal. You can return if your symptoms worsen.
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
## 2017-06-15 ENCOUNTER — Encounter (HOSPITAL_COMMUNITY): Payer: Self-pay | Admitting: Emergency Medicine

## 2017-06-15 ENCOUNTER — Emergency Department (HOSPITAL_COMMUNITY)
Admission: EM | Admit: 2017-06-15 | Discharge: 2017-06-15 | Disposition: A | Discharge status: Home / Self Care | Payer: Self-pay | Attending: Emergency Medicine | Admitting: Emergency Medicine

## 2017-06-15 DIAGNOSIS — M545 Low back pain: Secondary | ICD-10-CM | POA: Insufficient documentation

## 2017-06-15 DIAGNOSIS — R109 Unspecified abdominal pain: Secondary | ICD-10-CM | POA: Insufficient documentation

## 2017-06-15 DIAGNOSIS — Z5321 Procedure and treatment not carried out due to patient leaving prior to being seen by health care provider: Secondary | ICD-10-CM | POA: Insufficient documentation

## 2017-06-15 DIAGNOSIS — N898 Other specified noninflammatory disorders of vagina: Secondary | ICD-10-CM | POA: Insufficient documentation

## 2017-06-15 LAB — CBC
HEMATOCRIT: 35 % — AB (ref 36.0–46.0)
HEMOGLOBIN: 11.8 g/dL — AB (ref 12.0–15.0)
MCH: 29 pg (ref 26.0–34.0)
MCHC: 33.7 g/dL (ref 30.0–36.0)
MCV: 86 fL (ref 78.0–100.0)
Platelets: 192 10*3/uL (ref 150–400)
RBC: 4.07 MIL/uL (ref 3.87–5.11)
RDW: 13.2 % (ref 11.5–15.5)
WBC: 9.4 10*3/uL (ref 4.0–10.5)

## 2017-06-15 LAB — COMPREHENSIVE METABOLIC PANEL
ALBUMIN: 4.2 g/dL (ref 3.5–5.0)
ALK PHOS: 55 U/L (ref 38–126)
ALT: 13 U/L — ABNORMAL LOW (ref 14–54)
ANION GAP: 8 (ref 5–15)
AST: 20 U/L (ref 15–41)
BUN: 12 mg/dL (ref 6–20)
CHLORIDE: 105 mmol/L (ref 101–111)
CO2: 26 mmol/L (ref 22–32)
Calcium: 9.5 mg/dL (ref 8.9–10.3)
Creatinine, Ser: 0.77 mg/dL (ref 0.44–1.00)
GFR calc Af Amer: >60 mL/min (ref >60)
GFR calc non Af Amer: >60 mL/min (ref >60)
GLUCOSE: 88 mg/dL (ref 65–99)
POTASSIUM: 3.7 mmol/L (ref 3.5–5.1)
SODIUM: 139 mmol/L (ref 135–145)
Total Bilirubin: 0.5 mg/dL (ref 0.3–1.2)
Total Protein: 7.2 g/dL (ref 6.5–8.1)

## 2017-06-15 LAB — LIPASE, BLOOD: LIPASE: 29 U/L (ref 11–51)

## 2017-06-15 LAB — I-STAT BETA HCG BLOOD, ED (MC, WL, AP ONLY): I-stat hCG, quantitative: <5 m[IU]/mL (ref <5)

## 2017-06-15 NOTE — ED Triage Notes (Signed)
Patient c/o lower abd and lowe back pain with vaginal discharge with "sweaty smell" and irregular bleeding between periods over the past week patient denies any urinary problems. Patient tried taking Monstat OTC for yeast infection week ago but still having symptoms.

## 2017-06-15 NOTE — ED Triage Notes (Signed)
Pt called from triage with no answer 
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
## 2017-06-16 ENCOUNTER — Emergency Department (HOSPITAL_COMMUNITY)
Admission: EM | Admit: 2017-06-16 | Discharge: 2017-06-17 | Disposition: A | Discharge status: Home / Self Care | Payer: Self-pay | Attending: Emergency Medicine | Admitting: Emergency Medicine

## 2017-06-16 ENCOUNTER — Encounter (HOSPITAL_COMMUNITY): Payer: Self-pay

## 2017-06-16 DIAGNOSIS — Z202 Contact with and (suspected) exposure to infections with a predominantly sexual mode of transmission: Secondary | ICD-10-CM | POA: Insufficient documentation

## 2017-06-16 DIAGNOSIS — N898 Other specified noninflammatory disorders of vagina: Secondary | ICD-10-CM | POA: Insufficient documentation

## 2017-06-16 DIAGNOSIS — A599 Trichomoniasis, unspecified: Secondary | ICD-10-CM | POA: Insufficient documentation

## 2017-06-16 LAB — URINALYSIS, ROUTINE W REFLEX MICROSCOPIC
Bilirubin Urine: NEGATIVE
GLUCOSE, UA: NEGATIVE mg/dL
Ketones, ur: NEGATIVE mg/dL
Nitrite: NEGATIVE
PH: 5 (ref 5.0–8.0)
Protein, ur: NEGATIVE mg/dL
Specific Gravity, Urine: 1.013 (ref 1.005–1.030)

## 2017-06-16 LAB — POC URINE PREG, ED: Preg Test, Ur: NEGATIVE

## 2017-06-16 NOTE — ED Triage Notes (Signed)
Pt complains of abdominal pain with cramping and discharge She states that her discharge is watery and has a musky odor

## 2017-06-16 NOTE — ED Provider Notes (Signed)
[facility]-EMERGENCY DEPT Provider Note   CSN: [id number] Arrival date & time: 06/16/17  1811     History   Chief Complaint Chief Complaint  Patient presents with  . Abdominal Pain    HPI Andrea Riddle is a 31 y.o. female.  The history is provided by the patient and medical records.   31 y.o. F presenting to the ED for lower abdominal pain.  States this is been ongoing for about a week. States she has a lot of cramping in her lower abdomen. She reports associated thin, watery vaginal discharge and some menstrual spotting.  Has had some urinary frequency but denies dysuria. No flank pain.  No fever or chills. She has newly separated from her husband, unsure if he had any STD as she was concerned for some infidelity at the end of their marriage.  No hx of STD in the past.  Not currently followed by GYN.  Past Medical History:  Diagnosis Date  . Heart murmur     There are no active problems to display for this patient.   Past Surgical History:  Procedure Laterality Date  . LAPAROSCOPY FOR ECTOPIC PREGNANCY    . TYMPANOSTOMY TUBE PLACEMENT      OB History    Gravida Para Term Preterm AB Living   SAB TAB Ectopic Multiple Live Births       1           Home Medications    Prior to Admission medications   Medication Sig Start Date End Date Taking? Authorizing Provider  ibuprofen (ADVIL,MOTRIN) 800 MG tablet Take 1 tablet (800 mg total) by mouth 3 (three) times daily. 01/26/17   Maczis, Elmer Sow, PA-C  methocarbamol (ROBAXIN) 500 MG tablet Take 1 tablet (500 mg total) by mouth 2 (two) times daily. 01/26/17   Maczis, Elmer Sow, PA-C    Family History History reviewed. No pertinent family history.  Social History Social History  Substance Use Topics  . Smoking status: Never Smoker  . Smokeless tobacco: Never Used  . Alcohol use No     Allergies   Patient has no known allergies.   Review of Systems Review of Systems  Gastrointestinal: Positive for  abdominal pain.  Genitourinary: Positive for vaginal discharge.  All other systems reviewed and are negative.    Physical Exam Updated Vital Signs BP 120/70 (BP Location: Left Arm)   Pulse 65   Temp 99 F (37.2 C) (Oral)   Resp 18   Ht  (1.676 m)   Wt 69.1 kg (152 lb 4.8 oz)   LMP 05/24/2017   SpO2 100%   BMI 24.58 kg/m   Physical Exam  Constitutional: She is oriented to person, place, and time. She appears well-developed and well-nourished.  HENT:  Head: Normocephalic and atraumatic.  Mouth/Throat: Oropharynx is clear and moist.  Eyes: Pupils are equal, round, and reactive to light. Conjunctivae and EOM are normal.  Neck: Normal range of motion.  Cardiovascular: Normal rate, regular rhythm and normal heart sounds.   Pulmonary/Chest: Effort normal and breath sounds normal. No respiratory distress. She has no wheezes.  Abdominal: Soft. Bowel sounds are normal. There is no tenderness. There is no rebound and no CVA tenderness.  Genitourinary:  Genitourinary Comments: Exam chaperoned by RN Normal female external genitalia without visible lesions or rash; large amount of thin, watery discharge in the vaginal vault, no bleeding; no adnexal or CMT  Musculoskeletal: Normal  range of motion.  Neurological: She is alert and oriented to person, place, and time.  Skin: Skin is warm and dry.  Psychiatric: She has a normal mood and affect.  Nursing note and vitals reviewed.    ED Treatments / Results  Labs (all labs ordered are listed, but only abnormal results are displayed) Labs Reviewed  WET PREP, GENITAL - Abnormal; Notable for the following:       Result Value   Trich, Wet Prep PRESENT (*)    WBC, Wet Prep HPF POC MANY (*)    All other components within normal limits  URINALYSIS, ROUTINE W REFLEX MICROSCOPIC - Abnormal; Notable for the following:    APPearance CLOUDY (*)    Hgb urine dipstick SMALL (*)    Leukocytes, UA LARGE (*)    Bacteria, UA RARE (*)    Squamous  Epithelial / LPF 6-30 (*)    All other components within normal limits  POC URINE PREG, ED  GC/CHLAMYDIA PROBE AMP ([place]) NOT AT [facility]    EKG  EKG Interpretation None       Radiology No results found.  Procedures Procedures (including critical care time)  Medications Ordered in ED Medications - No data to display   Initial Impression / Assessment and Plan / ED Course  I have reviewed the triage vital signs and the nursing notes.  Pertinent labs & imaging results that were available during my care of the patient were reviewed by me and considered in my medical decision making (see chart for details).  31 year old female here with lower abdominal cramping and vaginal discharge. Recently separated from her husband, does express some concern for STD given his infidelities. She is afebrile and nontoxic. Abdominal exam is benign.  Pelvic exam performed, large amount of thin, watery vaginal discharge noted. There is no adnexal or cervical motion tenderness. UA with WBCs, rare bacteria. Wet prep with trichomoniasis noted.  Gc/chl pending.  Given findings on wet prep, will treat empirically here.  She will be notified if other cultures results are positive.  Can follow-up with women's clinic if any ongoing issues.  Discussed plan with patient, she acknowledged understanding and agreed with plan of care.  Return precautions given for new or worsening symptoms.  Final Clinical Impressions(s) / ED Diagnoses   Final diagnoses:  Vaginal discharge  Trichomonas infection    New Prescriptions Discharge Medication List as of 06/17/2017  1:11 AM       Garlon Hatchet, PA-C 06/17/17 0127    Arby Barrette, MD 06/17/17 2340

## 2017-06-16 NOTE — ED Triage Notes (Signed)
Pt called from triage with no answer 

## 2017-06-17 LAB — WET PREP, GENITAL
Clue Cells Wet Prep HPF POC: NONE SEEN
SPERM: NONE SEEN
YEAST WET PREP: NONE SEEN

## 2017-06-17 MED ORDER — AZITHROMYCIN 250 MG PO TABS
1000.0000 mg | ORAL_TABLET | Freq: Once | ORAL | Status: AC
  Administered 2017-06-17: 1000 mg via ORAL
  Filled 2017-06-17: qty 4

## 2017-06-17 MED ORDER — STERILE WATER FOR INJECTION IJ SOLN
INTRAMUSCULAR | Status: AC
  Administered 2017-06-17: 2.1 mL
  Filled 2017-06-17: qty 10

## 2017-06-17 MED ORDER — CEFTRIAXONE SODIUM 250 MG IJ SOLR
250.0000 mg | Freq: Once | INTRAMUSCULAR | Status: AC
  Administered 2017-06-17: 250 mg via INTRAMUSCULAR
  Filled 2017-06-17: qty 250

## 2017-06-17 MED ORDER — METRONIDAZOLE 500 MG PO TABS
2000.0000 mg | ORAL_TABLET | Freq: Once | ORAL | Status: AC
  Administered 2017-06-17: 2000 mg via ORAL
  Filled 2017-06-17: qty 4

## 2017-06-17 NOTE — Discharge Instructions (Signed)
You will be contacted in the next 48-72 hours if your other culture results are positive but you have already been treated.  These results will also be uploaded into mychart. Abstain from sexual activity for at least 7-10 days to let medications take effect. Can follow-up with the women's clinic for any ongoing issues-- can call to make  appt, they also have walk-in clinic. Return here for any new/worsening symptoms.

## 2017-06-18 LAB — GC/CHLAMYDIA PROBE AMP (~~LOC~~) NOT AT ARMC
CHLAMYDIA, DNA PROBE: NEGATIVE
NEISSERIA GONORRHEA: NEGATIVE

## 2018-11-01 ENCOUNTER — Emergency Department (HOSPITAL_BASED_OUTPATIENT_CLINIC_OR_DEPARTMENT_OTHER)
Admission: EM | Admit: 2018-11-01 | Discharge: 2018-11-01 | Disposition: A | Discharge status: Home / Self Care | Payer: Self-pay | Attending: Emergency Medicine | Admitting: Emergency Medicine

## 2018-11-01 ENCOUNTER — Other Ambulatory Visit: Payer: Self-pay

## 2018-11-01 ENCOUNTER — Encounter (HOSPITAL_BASED_OUTPATIENT_CLINIC_OR_DEPARTMENT_OTHER): Payer: Self-pay

## 2018-11-01 DIAGNOSIS — R350 Frequency of micturition: Secondary | ICD-10-CM | POA: Insufficient documentation

## 2018-11-01 DIAGNOSIS — N898 Other specified noninflammatory disorders of vagina: Secondary | ICD-10-CM | POA: Insufficient documentation

## 2018-11-01 DIAGNOSIS — F1210 Cannabis abuse, uncomplicated: Secondary | ICD-10-CM | POA: Insufficient documentation

## 2018-11-01 LAB — URINALYSIS, ROUTINE W REFLEX MICROSCOPIC
BILIRUBIN URINE: NEGATIVE
Glucose, UA: NEGATIVE mg/dL
Hgb urine dipstick: NEGATIVE
Ketones, ur: NEGATIVE mg/dL
Leukocytes,Ua: NEGATIVE
NITRITE: NEGATIVE
PROTEIN: NEGATIVE mg/dL
Specific Gravity, Urine: 1.01 (ref 1.005–1.030)
pH: 6 (ref 5.0–8.0)

## 2018-11-01 LAB — PREGNANCY, URINE: PREG TEST UR: NEGATIVE

## 2018-11-01 NOTE — ED Provider Notes (Signed)
[facility] EMERGENCY DEPARTMENT Provider Note   CSN: [id number] Arrival date & time: 11/01/18  1836    History   Chief Complaint Chief Complaint  Patient presents with  . Urinary Frequency    HPI Andrea Riddle is a 33 y.o. female.     Patient is a 33 year old female with no past medical history presents the emergency department for urinary frequency.  Reports that she has had frequency of urination for the past 4 days.  Reports that this feels like urinary tract infection that she has had in the past.  Denies any pelvic pain, hematuria, nausea, vomiting, fever.  She is currently on her menstrual cycle and this is normal for her.  Reports that she had some white discharge prior to the urinary frequency.  She also denies urgency.  She is sexually active and looks like to have STD testing on her urine.  Declines pelvic.     Past Medical History:  Diagnosis Date  . Heart murmur     There are no active problems to display for this patient.   Past Surgical History:  Procedure Laterality Date  . LAPAROSCOPY FOR ECTOPIC PREGNANCY    . TYMPANOSTOMY TUBE PLACEMENT       OB History    Gravida  4   Para      Term      Preterm      AB  1   Living  3     SAB      TAB      Ectopic  1   Multiple      Live Births               Home Medications    Prior to Admission medications   Medication Sig Start Date End Date Taking? Authorizing Provider  ibuprofen (ADVIL,MOTRIN) 800 MG tablet Take 1 tablet (800 mg total) by mouth 3 (three) times daily. Patient not taking: Reported on 06/16/2017 01/26/17   Maczis, Elmer Sow, PA-C  methocarbamol (ROBAXIN) 500 MG tablet Take 1 tablet (500 mg total) by mouth 2 (two) times daily. Patient not taking: Reported on 06/16/2017 01/26/17   Maczis, Elmer Sow, PA-C    Family History No family history on file.  Social History Social History   Tobacco Use  . Smoking status: Never Smoker  . Smokeless tobacco: Never Used   Substance Use Topics  . Alcohol use: Yes    Comment: occ  . Drug use: Yes    Types: Marijuana     Allergies   Patient has no known allergies.   Review of Systems Review of Systems  Constitutional: Negative.   Eyes: Negative.   Respiratory: Negative for cough and shortness of breath.   Gastrointestinal: Negative for abdominal pain, diarrhea, nausea and vomiting.  Endocrine: Negative for polyuria.  Genitourinary: Positive for frequency, urgency and vaginal discharge. Negative for decreased urine volume, difficulty urinating, dysuria, flank pain, genital sores, hematuria, menstrual problem, pelvic pain, vaginal bleeding and vaginal pain.  Musculoskeletal: Negative for arthralgias, back pain, gait problem, myalgias and neck pain.  Skin: Negative for rash.  Neurological: Negative for dizziness and light-headedness.     Physical Exam Updated Vital Signs BP (!) 121/55 (BP Location: Left Arm)   Pulse 63   Temp 98.2 F (36.8 C) (Oral)   Resp 18   Ht 5\' 7"  (1.702 m)   Wt 68.5 kg   LMP 10/27/2018   SpO2 100%   BMI 23.65 kg/m   Physical Exam  Vitals signs and nursing note reviewed.  Constitutional:      Appearance: Normal appearance.  HENT:     Head: Normocephalic.     Mouth/Throat:     Pharynx: Oropharynx is clear.  Eyes:     Conjunctiva/sclera: Conjunctivae normal.  Cardiovascular:     Rate and Rhythm: Normal rate and regular rhythm.  Pulmonary:     Effort: Pulmonary effort is normal.  Abdominal:     General: Abdomen is flat. Bowel sounds are normal. There is no distension.     Tenderness: There is no abdominal tenderness. There is no right CVA tenderness, left CVA tenderness or guarding.  Skin:    General: Skin is dry.  Neurological:     Mental Status: She is alert.  Psychiatric:        Mood and Affect: Mood normal.      ED Treatments / Results  Labs (all labs ordered are listed, but only abnormal results are displayed) Labs Reviewed  URINE CULTURE    URINALYSIS, ROUTINE W REFLEX MICROSCOPIC  PREGNANCY, URINE  GC/CHLAMYDIA PROBE AMP ([place]) NOT AT [facility]    EKG None  Radiology No results found.  Procedures Procedures (including critical care time)  Medications Ordered in ED Medications - No data to display   Initial Impression / Assessment and Plan / ED Course  I have reviewed the triage vital signs and the nursing notes.  Pertinent labs & imaging results that were available during my care of the patient were reviewed by me and considered in my medical decision making (see chart for details).  Clinical Course as of Nov 01 2036  Tue Nov 01, 2018  2034 Urinalysis is negative, urine was culture. GC/chlamydia testing is pending.  Patient reports that she has my chart and will be able to access this at home.  She was advised that she needs to return to be treated if anything is positive.  I offered her Pyridium for her symptoms but stated no antibiotics since her urinalysis is clear.  Patient then stated "that is it?  That is all you are going to do for me?".  I offered her a full pelvic exam but she declined.  I do not see an indication for any lab work as her physical exam was negative and her symptoms are only urinary frequency, urgency and vaginal discharge.  She is not having any pelvic pain to warrant any imaging. I explained to the patient she should return if she has new or worsened symptoms.    [KM]    Clinical Course User Index [KM] Arlyn Dunning, PA-C       Based on review of vitals, medical screening exam, lab work and/or imaging, there does not appear to be an acute, emergent etiology for the patient's symptoms. Counseled pt on good return precautions and encouraged both PCP and ED follow-up as needed.    Clinical Impression: 1. Urinary frequency     Disposition: Discharge   This note was prepared with assistance of Dragon voice recognition software. Occasional wrong-word or sound-a-like substitutions  may have occurred due to the inherent limitations of voice recognition software.   Final Clinical Impressions(s) / ED Diagnoses   Final diagnoses:  Urinary frequency    ED Discharge Orders    None       Jeral Pinch 11/01/18 2038    Charlynne Pander, MD 11/01/18 [phone]

## 2018-11-01 NOTE — ED Triage Notes (Signed)
C/o urinary freq x 3 days-NAD-steady gait

## 2018-11-01 NOTE — ED Notes (Addendum)
Pt left prior to receiving discharge instructions from RN. Unable to complete discharge instructions/vitals nor obtain esignature for this reason.

## 2018-11-01 NOTE — Discharge Instructions (Signed)
Please check results on MyChart and return here or follow-up with your primary care doctor for treatment if anything is positive.

## 2018-11-01 NOTE — ED Notes (Signed)
Pt not in room.

## 2018-11-03 LAB — URINE CULTURE: Culture: NO GROWTH

## 2018-11-03 LAB — GC/CHLAMYDIA PROBE AMP (~~LOC~~) NOT AT ARMC
CHLAMYDIA, DNA PROBE: POSITIVE — AB
NEISSERIA GONORRHEA: NEGATIVE

## 2018-11-04 ENCOUNTER — Encounter (HOSPITAL_BASED_OUTPATIENT_CLINIC_OR_DEPARTMENT_OTHER): Payer: Self-pay | Admitting: *Deleted

## 2018-11-04 ENCOUNTER — Other Ambulatory Visit: Payer: Self-pay

## 2018-11-04 ENCOUNTER — Emergency Department (HOSPITAL_BASED_OUTPATIENT_CLINIC_OR_DEPARTMENT_OTHER)
Admission: EM | Admit: 2018-11-04 | Discharge: 2018-11-04 | Disposition: A | Discharge status: Home / Self Care | Payer: Self-pay | Attending: Emergency Medicine | Admitting: Emergency Medicine

## 2018-11-04 DIAGNOSIS — A5602 Chlamydial vulvovaginitis: Secondary | ICD-10-CM | POA: Insufficient documentation

## 2018-11-04 DIAGNOSIS — A749 Chlamydial infection, unspecified: Secondary | ICD-10-CM

## 2018-11-04 MED ORDER — AZITHROMYCIN 250 MG PO TABS
1000.0000 mg | ORAL_TABLET | Freq: Once | ORAL | Status: AC
  Administered 2018-11-04: 1000 mg via ORAL
  Filled 2018-11-04: qty 4

## 2018-11-04 MED ORDER — CEFTRIAXONE SODIUM 250 MG IJ SOLR
250.0000 mg | Freq: Once | INTRAMUSCULAR | Status: AC
  Administered 2018-11-04: 250 mg via INTRAMUSCULAR
  Filled 2018-11-04: qty 250

## 2018-11-04 NOTE — Discharge Instructions (Addendum)
Abstain from intercourse, your partner will need to go to the health department for free treatment. Abstain from intercourse for 10 days after treatment (this includes all forms of intercourse).

## 2018-11-04 NOTE — ED Triage Notes (Signed)
States she had STD testing and the test were positive. She was not treated at the time.

## 2018-11-04 NOTE — ED Provider Notes (Signed)
[facility] EMERGENCY DEPARTMENT Provider Note   CSN: [id number] Arrival date & time: 11/04/18  1131    History   Chief Complaint No chief complaint on file.   HPI Andrea Riddle is a 33 y.o. female.     33 year old female presents for treatment of chlamydia.  Patient was in the emergency room 3 days ago, contacted advised she needed treatment for chlamydia.  Patient denies any complaints today.  Reports prior history of chlamydia which was also treated at that time.  Patient is aware that her partners will need to be treated.     Past Medical History:  Diagnosis Date  . Heart murmur     There are no active problems to display for this patient.   Past Surgical History:  Procedure Laterality Date  . LAPAROSCOPY FOR ECTOPIC PREGNANCY    . TYMPANOSTOMY TUBE PLACEMENT       OB History    Gravida  4   Para      Term      Preterm      AB  1   Living  3     SAB      TAB      Ectopic  1   Multiple      Live Births               Home Medications    Prior to Admission medications   Medication Sig Start Date End Date Taking? Authorizing Provider  ibuprofen (ADVIL,MOTRIN) 800 MG tablet Take 1 tablet (800 mg total) by mouth 3 (three) times daily. Patient not taking: Reported on 06/16/2017 01/26/17   Maczis, Elmer Sow, PA-C  methocarbamol (ROBAXIN) 500 MG tablet Take 1 tablet (500 mg total) by mouth 2 (two) times daily. Patient not taking: Reported on 06/16/2017 01/26/17   Maczis, Elmer Sow, PA-C    Family History No family history on file.  Social History Social History   Tobacco Use  . Smoking status: Never Smoker  . Smokeless tobacco: Never Used  Substance Use Topics  . Alcohol use: Yes    Comment: occ  . Drug use: Yes    Types: Marijuana     Allergies   Patient has no known allergies.   Review of Systems Review of Systems  Constitutional: Negative for fever.  Gastrointestinal: Negative for abdominal pain, nausea and  vomiting.  Skin: Negative for rash and wound.  Allergic/Immunologic: Negative for immunocompromised state.     Physical Exam Updated Vital Signs BP (!) 122/91   Pulse 70   Temp 98.3 F (36.8 C) (Oral)   Resp 16   LMP 10/27/2018   SpO2 100%   Physical Exam Vitals signs and nursing note reviewed.  Constitutional:      General: She is not in acute distress.    Appearance: She is well-developed. She is not diaphoretic.  HENT:     Head: Normocephalic and atraumatic.  Cardiovascular:     Rate and Rhythm: Normal rate and regular rhythm.     Pulses: Normal pulses.     Heart sounds: Normal heart sounds.  Pulmonary:     Effort: Pulmonary effort is normal.  Abdominal:     General: Abdomen is flat.     Tenderness: There is no abdominal tenderness.  Skin:    General: Skin is warm and dry.  Neurological:     Mental Status: She is alert and oriented to person, place, and time.  Psychiatric:  Behavior: Behavior normal.      ED Treatments / Results  Labs (all labs ordered are listed, but only abnormal results are displayed) Labs Reviewed - No data to display  EKG None  Radiology No results found.  Procedures Procedures (including critical care time)  Medications Ordered in ED Medications  azithromycin (ZITHROMAX) tablet 1,000 mg (1,000 mg Oral Given 11/04/18 1152)  cefTRIAXone (ROCEPHIN) injection 250 mg (250 mg Intramuscular Given 11/04/18 1152)     Initial Impression / Assessment and Plan / ED Course  I have reviewed the triage vital signs and the nursing notes.  Pertinent labs & imaging results that were available during my care of the patient were reviewed by me and considered in my medical decision making (see chart for details).  Clinical Course as of Nov 04 1213  Fri Nov 04, 2018  [id number] 33 year old female presents for treatment of chlamydia.  Importance of all partners being treated and abstinence x10 days.  Patient verbalized understanding of discharge  instructions and plan.   [LM]    Clinical Course User Index [LM] Jeannie Fend, PA-C      Final Clinical Impressions(s) / ED Diagnoses   Final diagnoses:  Chlamydia    ED Discharge Orders    None       Jeannie Fend, PA-C 11/04/18 1215    Long, Arlyss Repress, MD 11/05/18 1253

## 2019-01-31 ENCOUNTER — Encounter (HOSPITAL_COMMUNITY): Payer: Self-pay | Admitting: Emergency Medicine

## 2019-01-31 ENCOUNTER — Emergency Department (HOSPITAL_COMMUNITY): Payer: Self-pay

## 2019-01-31 ENCOUNTER — Emergency Department (HOSPITAL_COMMUNITY)
Admission: EM | Admit: 2019-01-31 | Discharge: 2019-01-31 | Disposition: A | Discharge status: Home / Self Care | Payer: Self-pay | Attending: Emergency Medicine | Admitting: Emergency Medicine

## 2019-01-31 ENCOUNTER — Other Ambulatory Visit: Payer: Self-pay

## 2019-01-31 DIAGNOSIS — R1031 Right lower quadrant pain: Secondary | ICD-10-CM

## 2019-01-31 DIAGNOSIS — B9689 Other specified bacterial agents as the cause of diseases classified elsewhere: Secondary | ICD-10-CM | POA: Insufficient documentation

## 2019-01-31 DIAGNOSIS — N939 Abnormal uterine and vaginal bleeding, unspecified: Secondary | ICD-10-CM | POA: Insufficient documentation

## 2019-01-31 DIAGNOSIS — N760 Acute vaginitis: Secondary | ICD-10-CM | POA: Insufficient documentation

## 2019-01-31 DIAGNOSIS — R197 Diarrhea, unspecified: Secondary | ICD-10-CM | POA: Insufficient documentation

## 2019-01-31 LAB — CBC
HCT: 38.7 % (ref 36.0–46.0)
Hemoglobin: 11.9 g/dL — ABNORMAL LOW (ref 12.0–15.0)
MCH: 25.6 pg — ABNORMAL LOW (ref 26.0–34.0)
MCHC: 30.7 g/dL (ref 30.0–36.0)
MCV: 83.4 fL (ref 80.0–100.0)
Platelets: 233 10*3/uL (ref 150–400)
RBC: 4.64 MIL/uL (ref 3.87–5.11)
RDW: 15.1 % (ref 11.5–15.5)
WBC: 10 10*3/uL (ref 4.0–10.5)
nRBC: 0 % (ref 0.0–0.2)

## 2019-01-31 LAB — COMPREHENSIVE METABOLIC PANEL
ALT: 23 U/L (ref 0–44)
AST: 27 U/L (ref 15–41)
Albumin: 4.2 g/dL (ref 3.5–5.0)
Alkaline Phosphatase: 57 U/L (ref 38–126)
Anion gap: 8 (ref 5–15)
BUN: 10 mg/dL (ref 6–20)
CO2: 27 mmol/L (ref 22–32)
Calcium: 9.5 mg/dL (ref 8.9–10.3)
Chloride: 103 mmol/L (ref 98–111)
Creatinine, Ser: 0.8 mg/dL (ref 0.44–1.00)
GFR calc Af Amer: >60 mL/min (ref >60)
GFR calc non Af Amer: >60 mL/min (ref >60)
Glucose, Bld: 92 mg/dL (ref 70–99)
Potassium: 3.4 mmol/L — ABNORMAL LOW (ref 3.5–5.1)
Sodium: 138 mmol/L (ref 135–145)
Total Bilirubin: 0.4 mg/dL (ref 0.3–1.2)
Total Protein: 7.6 g/dL (ref 6.5–8.1)

## 2019-01-31 LAB — URINALYSIS, ROUTINE W REFLEX MICROSCOPIC
Bilirubin Urine: NEGATIVE
Glucose, UA: NEGATIVE mg/dL
Hgb urine dipstick: NEGATIVE
Ketones, ur: NEGATIVE mg/dL
Leukocytes,Ua: NEGATIVE
Nitrite: NEGATIVE
Protein, ur: NEGATIVE mg/dL
Specific Gravity, Urine: 1.006 (ref 1.005–1.030)
pH: 8 (ref 5.0–8.0)

## 2019-01-31 LAB — I-STAT BETA HCG BLOOD, ED (MC, WL, AP ONLY): I-stat hCG, quantitative: <5 m[IU]/mL (ref <5)

## 2019-01-31 LAB — WET PREP, GENITAL
Sperm: NONE SEEN
Trich, Wet Prep: NONE SEEN
WBC, Wet Prep HPF POC: NONE SEEN
Yeast Wet Prep HPF POC: NONE SEEN

## 2019-01-31 LAB — LIPASE, BLOOD: Lipase: 36 U/L (ref 11–51)

## 2019-01-31 MED ORDER — IOHEXOL 300 MG/ML  SOLN
100.0000 mL | Freq: Once | INTRAMUSCULAR | Status: AC | PRN
  Administered 2019-01-31: 100 mL via INTRAVENOUS

## 2019-01-31 MED ORDER — SODIUM CHLORIDE (PF) 0.9 % IJ SOLN
INTRAMUSCULAR | Status: AC
  Filled 2019-01-31: qty 50

## 2019-01-31 MED ORDER — METRONIDAZOLE 500 MG PO TABS: 500.0000 mg | ORAL_TABLET | Freq: Two times a day (BID) | ORAL | 0 refills | Status: AC

## 2019-01-31 NOTE — Discharge Instructions (Addendum)
You were seen in the ED today for abdominal pain on your right lower side; your ultrasound and cat scan of your abdomen were both negative for any acute findings. You were found to have bacterial vaginosis during your pelvic exam for which I have sent Flagyl. Please take this medication for the next week and refrain from alcohol use while you are on it. Please follow up with OBGYN regarding your abdominal pain/irregular period.

## 2019-01-31 NOTE — ED Triage Notes (Signed)
Pt c/o lower abd pains for couple days. States that her cycle this month was abnormal for her. Hx ectopic pregnancy.

## 2019-01-31 NOTE — ED Provider Notes (Signed)
[facility]-EMERGENCY DEPT Provider Note   CSN: [id number] Arrival date & time: 01/31/19  1829    History   Chief Complaint Chief Complaint  Patient presents with   Abdominal Pain    HPI Andrea Riddle is a 33 y.o. female who presents to the ED complaining of gradual onset, constant, waxing and waning, achy, suprapubic/RLQ abdominal pain that has been present for about 2 weeks intermittently but constant for the past 3 days. Pt is also complaining of looser stools and an abnormal menstrual cycle this month - she reports her period typically lasts 5-6 days but this month (05/02) she only had 2 days of very light bleeding. Her pain began shortly after starting her period. Pt reports hx of an ectopic pregnancy in 2009 and states this feels similar. She has also noticed that since her abnormal period she has had some brown spotting and pain with intercourse. Pt has been taking Ibuprofen and Aleve without relief. Denies fever, chills, nausea, vomiting, constipation, hematochezia, dysuria, frequency, vaginal discharge, pelvic pain, or any other associated symptoms.         Past Medical History:  Diagnosis Date   Heart murmur     There are no active problems to display for this patient.   Past Surgical History:  Procedure Laterality Date   LAPAROSCOPY FOR ECTOPIC PREGNANCY     TYMPANOSTOMY TUBE PLACEMENT       OB History    Gravida  4   Para      Term      Preterm      AB  1   Living  3     SAB      TAB      Ectopic  1   Multiple      Live Births               Home Medications    Prior to Admission medications   Medication Sig Start Date End Date Taking? Authorizing Provider  ibuprofen (ADVIL) 200 MG tablet Take 800 mg by mouth every 6 (six) hours as needed for moderate pain.   Yes [provider]  ibuprofen (ADVIL,MOTRIN) 800 MG tablet Take 1 tablet (800 mg total) by mouth 3 (three) times daily. Patient not taking:  Reported on 06/16/2017 01/26/17   Maczis, Elmer SowMichael M, PA-C  methocarbamol (ROBAXIN) 500 MG tablet Take 1 tablet (500 mg total) by mouth 2 (two) times daily. Patient not taking: Reported on 06/16/2017 01/26/17   Maczis, Elmer SowMichael M, PA-C  metroNIDAZOLE (FLAGYL) 500 MG tablet Take 1 tablet (500 mg total) by mouth 2 (two) times daily. 01/31/19   Tanda RockersVenter, Fintan Grater, PA-C    Family History No family history on file.  Social History Social History   Tobacco Use   Smoking status: Never Smoker   Smokeless tobacco: Never Used  Substance Use Topics   Alcohol use: Yes    Comment: occ   Drug use: Yes    Types: Marijuana     Allergies   Patient has no known allergies.   Review of Systems Review of Systems  Constitutional: Negative for chills and fever.  HENT: Negative for congestion.   Eyes: Negative for redness.  Respiratory: Negative for cough and shortness of breath.   Cardiovascular: Negative for chest pain and leg swelling.  Gastrointestinal: Positive for abdominal pain and diarrhea. Negative for blood in stool, constipation, nausea and vomiting.  Genitourinary: Positive for menstrual problem. Negative for dysuria, flank pain, frequency, hematuria, pelvic pain,  vaginal discharge and vaginal pain.  Musculoskeletal: Negative for back pain.  Skin: Negative for rash.  Neurological: Negative for dizziness, syncope and light-headedness.     Physical Exam Updated Vital Signs BP (!) 156/86 (BP Location: Right Arm)    Pulse 100    Temp 99.2 F (37.3 C)    Resp 17    LMP 01/14/2019    SpO2 100%   Physical Exam Vitals signs and nursing note reviewed.  Constitutional:      Appearance: She is not ill-appearing.  HENT:     Head: Normocephalic and atraumatic.  Eyes:     Conjunctiva/sclera: Conjunctivae normal.  Neck:     Musculoskeletal: Neck supple.  Cardiovascular:     Rate and Rhythm: Normal rate and regular rhythm.     Heart sounds: Normal heart sounds.  Pulmonary:     Effort:  Pulmonary effort is normal.     Breath sounds: Normal breath sounds. No wheezing, rhonchi or rales.  Abdominal:     General: Abdomen is flat. Bowel sounds are normal. There is no distension.     Palpations: Abdomen is soft.     Tenderness: There is abdominal tenderness in the right lower quadrant. There is no right CVA tenderness, left CVA tenderness, guarding or rebound. Negative signs include Rovsing's sign, McBurney's sign, psoas sign and obturator sign.     Comments: Very minimal tenderness with deep palpation of RLQ. No rebound or guarding. Negative McBurney's sign.   Genitourinary:    Comments: Chaperone present for exam. No rashes, lesions, or tenderness to external genitalia. No erythema, injury, or tenderness to vaginal mucosa. No vaginal discharge within vaginal vault. Small amount of brown blood in vault. Mild right adnexal tenderness. No CMT, cervical friability, or discharge from cervical os. Cervical os is closed. Uterus non-deviated, mobile, nonTTP, and without enlargement.   Skin:    General: Skin is warm and dry.  Neurological:     Mental Status: She is alert.      ED Treatments / Results  Labs (all labs ordered are listed, but only abnormal results are displayed) Labs Reviewed  WET PREP, GENITAL - Abnormal; Notable for the following components:      Result Value   Clue Cells Wet Prep HPF POC PRESENT (*)    All other components within normal limits  COMPREHENSIVE METABOLIC PANEL - Abnormal; Notable for the following components:   Potassium 3.4 (*)    All other components within normal limits  CBC - Abnormal; Notable for the following components:   Hemoglobin 11.9 (*)    MCH 25.6 (*)    All other components within normal limits  URINALYSIS, ROUTINE W REFLEX MICROSCOPIC - Abnormal; Notable for the following components:   Color, Urine STRAW (*)    All other components within normal limits  LIPASE, BLOOD  RPR  HIV ANTIBODY (ROUTINE TESTING W REFLEX)  I-STAT BETA  HCG BLOOD, ED ([facility], [facility], [facility] ONLY)  GC/CHLAMYDIA PROBE AMP ([place]) NOT AT [facility]    EKG None  Radiology US Transvaginal Non-ob  Result Date: 01/31/2019 CLINICAL DATA:  Right lower pain EXAM: TRANSABDOMINAL AND TRANSVAGINAL ULTRASOUND OF PELVIS TECHNIQUE: Both transabdominal and transvaginal ultrasound examinations of the pelvis were performed. Transabdominal technique was performed for global imaging of the pelvis including uterus, ovaries, adnexal regions, and pelvic cul-de-sac. It was necessary to proceed with endovaginal exam following the transabdominal exam to visualize the uterus endometrium ovaries. COMPARISON:  Pelvic ultrasound 05/25/2016 FINDINGS: Uterus Measurements: 8.6 x 4.5 x  4.3 cm = volume: 88 mL. Possible submucosal fibroid within the posterior uterus measuring 1.5 x 1.8 x 2 cm. Endometrium Thickness: 3 mm.  No focal abnormality visualized. Right ovary Measurements: 2.4 x 1.6 x 2.1 cm = volume: 4.4 mL. Normal appearance/no adnexal mass. Left ovary Measurements: 3 x 1.4 x 2.6 cm = volume: 5.5 mL. Normal appearance/no adnexal mass. Other findings Trace free fluid IMPRESSION: 1. Normal ultrasound appearance of the ovaries and adnexa 2. Possible small submucosal fibroid within the posterior uterus 3. Trace free fluid Electronically Signed   By: Jasmine Pang M.D.   On: 01/31/2019 21:09   US Pelvis Complete  Result Date: 01/31/2019 CLINICAL DATA:  Right lower pain EXAM: TRANSABDOMINAL AND TRANSVAGINAL ULTRASOUND OF PELVIS TECHNIQUE: Both transabdominal and transvaginal ultrasound examinations of the pelvis were performed. Transabdominal technique was performed for global imaging of the pelvis including uterus, ovaries, adnexal regions, and pelvic cul-de-sac. It was necessary to proceed with endovaginal exam following the transabdominal exam to visualize the uterus endometrium ovaries. COMPARISON:  Pelvic ultrasound 05/25/2016 FINDINGS: Uterus Measurements: 8.6 x 4.5 x 4.3 cm = volume: 88  mL. Possible submucosal fibroid within the posterior uterus measuring 1.5 x 1.8 x 2 cm. Endometrium Thickness: 3 mm.  No focal abnormality visualized. Right ovary Measurements: 2.4 x 1.6 x 2.1 cm = volume: 4.4 mL. Normal appearance/no adnexal mass. Left ovary Measurements: 3 x 1.4 x 2.6 cm = volume: 5.5 mL. Normal appearance/no adnexal mass. Other findings Trace free fluid IMPRESSION: 1. Normal ultrasound appearance of the ovaries and adnexa 2. Possible small submucosal fibroid within the posterior uterus 3. Trace free fluid Electronically Signed   By: Jasmine Pang M.D.   On: 01/31/2019 21:09   Ct Abdomen Pelvis W Contrast  Result Date: 01/31/2019 CLINICAL DATA:  33 year old female with lower abdominal pain for 3 days. EXAM: CT ABDOMEN AND PELVIS WITH CONTRAST TECHNIQUE: Multidetector CT imaging of the abdomen and pelvis was performed using the standard protocol following bolus administration of intravenous contrast. CONTRAST:  OMNIPAQUE IOHEXOL 300 MG/ML  SOLN COMPARISON:  Pelvis ultrasound earlier today. FINDINGS: Lower chest: Negative. Hepatobiliary: Negative liver and gallbladder. Pancreas: Negative. Spleen: Negative. Adrenals/Urinary Tract: Normal adrenal glands. Symmetric and normal renal enhancement. No nephrolithiasis or perinephric stranding. Proximal ureters appear decompressed. Diminutive and unremarkable urinary bladder. Scattered pelvic phleboliths. Stomach/Bowel: Decompressed and negative rectosigmoid colon. Decompressed and negative descending colon. Negative transverse colon containing gas. Mild retained stool in the right colon. Surgical clips are located near the tip of the cecum and distal small bowel, might be related to prior appendectomy, although there appears to be a small normal appendix visible on coronal image 53. No pericecal inflammation. The terminal ileum appears normal. No dilated or abnormal small bowel. Mostly decompressed and negative stomach. No free air, abdominal free  fluid. Vascular/Lymphatic: Major arterial structures are patent with no atherosclerosis. Portal venous system appears patent. No lymphadenopathy. Reproductive: Negative; small surgical clips in the right lower quadrant near the right uterine fundus. Other: Trace pelvic free fluid in the cul-de-sac. Musculoskeletal: Negative. IMPRESSION: Postoperative changes in the right lower quadrant adjacent to the uterine fundus but otherwise negative CT Abdomen and Pelvis; trace pelvic free fluid is likely physiologic. Electronically Signed   By: Odessa Fleming M.D.   On: 01/31/2019 22:22    Procedures Procedures (including critical care time)  Medications Ordered in ED Medications  sodium chloride (PF) 0.9 % injection (has no administration in time range)  iohexol (OMNIPAQUE) 300 MG/ML solution 100 mL (  100 mLs Intravenous Contrast Given 01/31/19 2203)     Initial Impression / Assessment and Plan / ED Course  I have reviewed the triage vital signs and the nursing notes.  Pertinent labs & imaging results that were available during my care of the patient were reviewed by me and considered in my medical decision making (see chart for details).    Pt is a 33 year old female who presents to the ED with 3 days of constant suprapubic/RLQ abdominal pain with looser stools with shorter menstrual cycle about 2 weeks ago. Pt not exquisitely tender on exam; negative McBurney's point. Will get baseline labs including CBC, CMP, Lipase, U/A, and betaHCG. Pelvic exam also to be initiated given lower abdominal pain. Ultrasound ordered to rule out ovarian torsion at this time; beta hcg negative so can rule out ectopic.   Wet prep with clue cells; will treat outpatient with Flagyl. Ultrasound with small fibroid; do not believe this is causing patient's pain. Will proceed with CT A/P at this time.   Ct A/P without obvious finding for pain; some postoperative changes noted as well as free fluid also seen on ultrasound. At this point  in time all emergent causes of abdominal pain have been ruled out; do not suspect PID as pt did not have any CMT on exam. Advised she will receive a call in 2-3 days for GC/Chlamydia results. Will have patient follow up with OBGYN regarding her symptoms as well. Pt in agreement with plan at this time and stable for discharge home. Strict return precautions discussed.        Final Clinical Impressions(s) / ED Diagnoses   Final diagnoses:  RLQ abdominal pain  Bacterial vaginosis  Abnormal uterine bleeding    ED Discharge Orders         Ordered    metroNIDAZOLE (FLAGYL) 500 MG tablet  2 times daily     01/31/19 2240           Tanda Rockers, PA-C 01/31/19 2324    Tegeler, Canary Brim, MD 01/31/19 [phone]

## 2019-02-01 LAB — RPR: RPR Ser Ql: NONREACTIVE

## 2019-02-01 LAB — HIV ANTIBODY (ROUTINE TESTING W REFLEX): HIV Screen 4th Generation wRfx: NONREACTIVE

## 2019-02-02 LAB — GC/CHLAMYDIA PROBE AMP (~~LOC~~) NOT AT ARMC
Chlamydia: NEGATIVE
Neisseria Gonorrhea: NEGATIVE

## 2019-09-26 IMAGING — CT CT ABDOMEN AND PELVIS WITH CONTRAST
2 of 4 series · 16 of 46 positions shown, 18 images · IV contrast (ISOVUE)
Comparison: Pelvis ultrasound earlier today.

CLINICAL DATA: 33-year-old female with lower abdominal pain for 3
days.

EXAM:
CT ABDOMEN AND PELVIS WITH CONTRAST
TECHNIQUE: Multidetector CT imaging of the abdomen and pelvis was performed
using the standard protocol following bolus administration of
intravenous contrast.
CONTRAST:  100mL OMNIPAQUE IOHEXOL 300 MG/ML  SOLN

[Series 2: axial st · axial · 0.78mm/px · z∈[+786,+1206]mm · 13 of 94 slices shown, 15 images]
[im 5/94  soft-tissue]
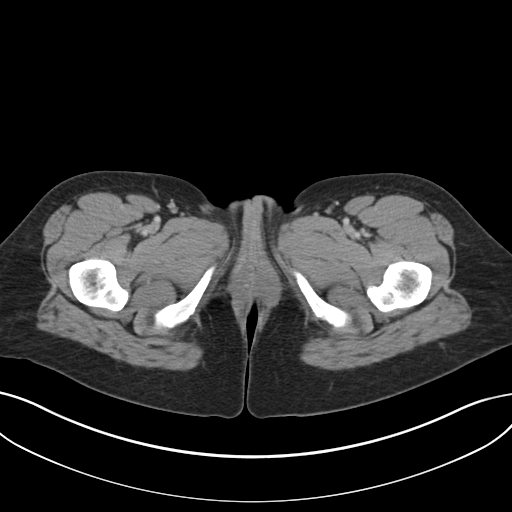
[im 5/94  bone]
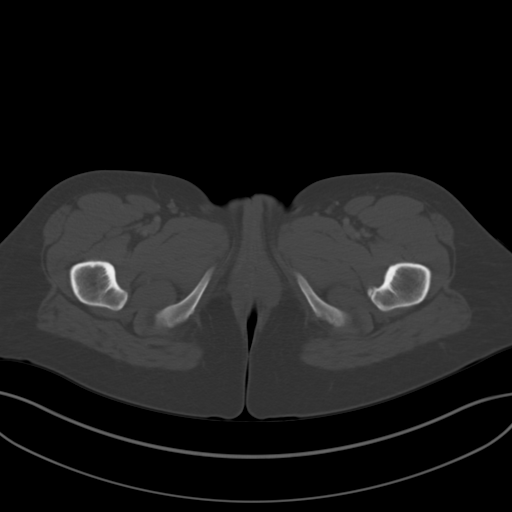
[im 14/94  soft-tissue]
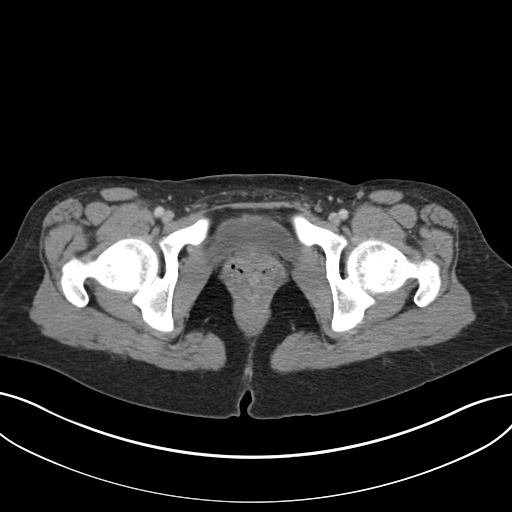
[im 18/94  soft-tissue]
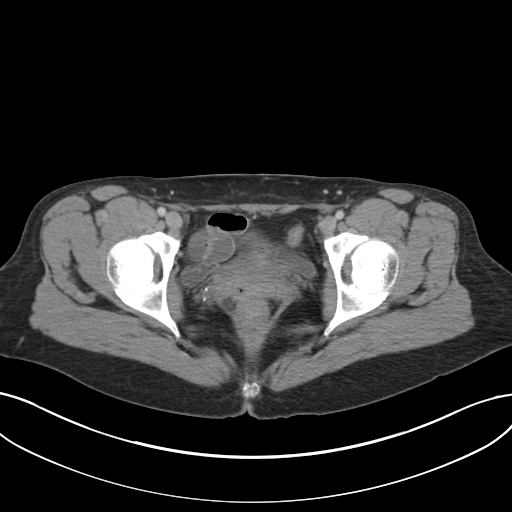
[im 27/94  soft-tissue]
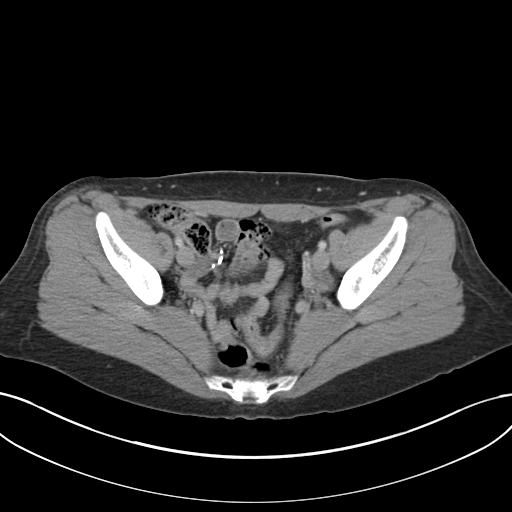
[im 32/94  soft-tissue]
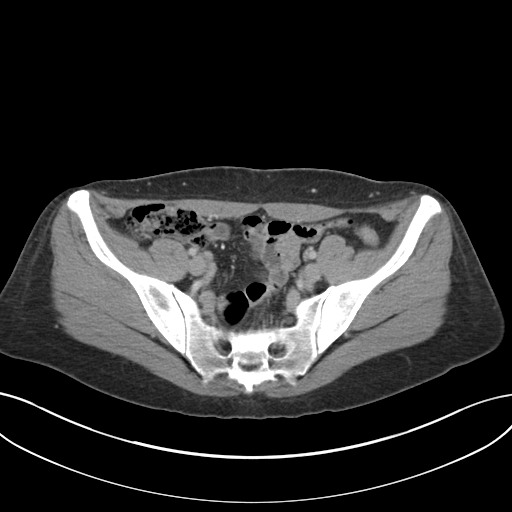
[im 40/94  soft-tissue]
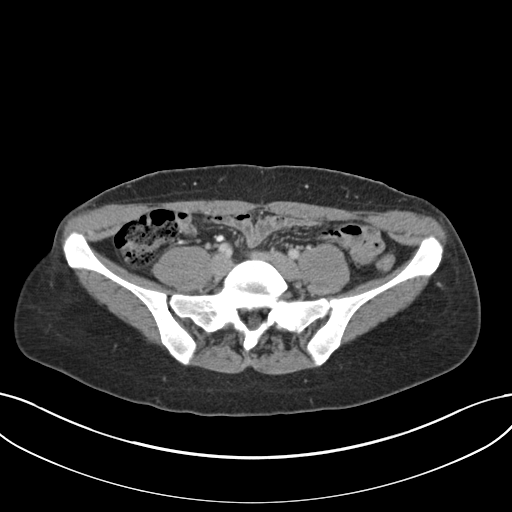
[im 49/94  soft-tissue]
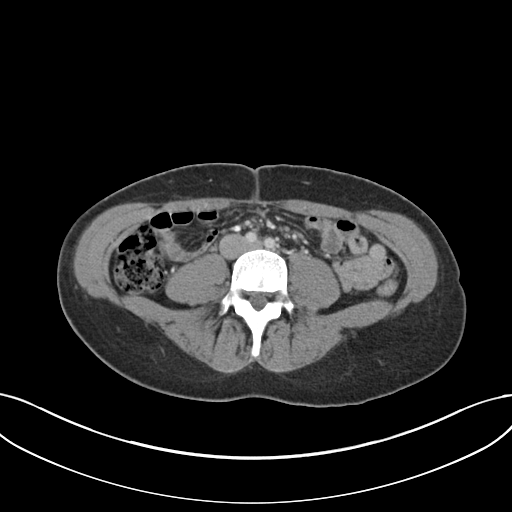
[im 54/94  soft-tissue]
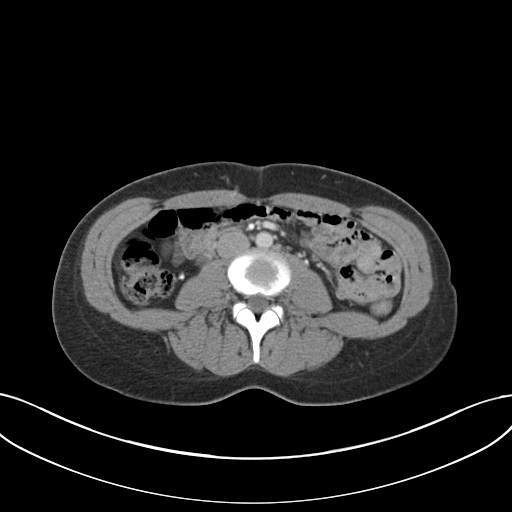
[im 63/94  soft-tissue]
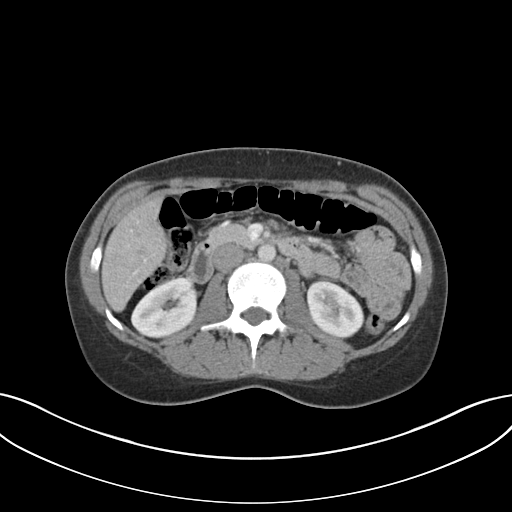
[im 63/94  bone]
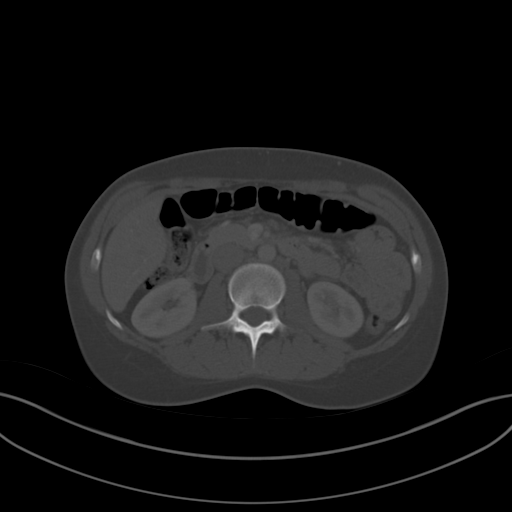
[im 67/94  soft-tissue]
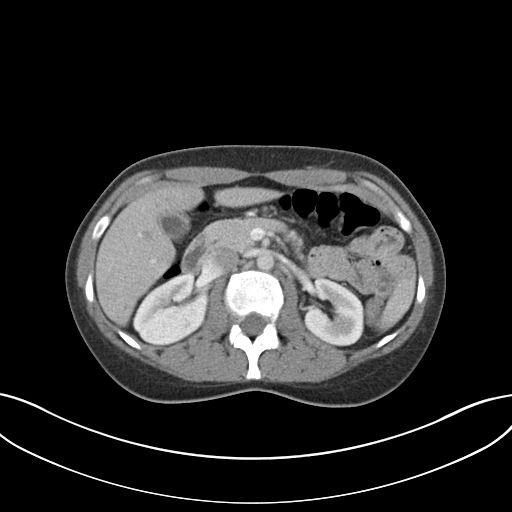
[im 76/94  soft-tissue]
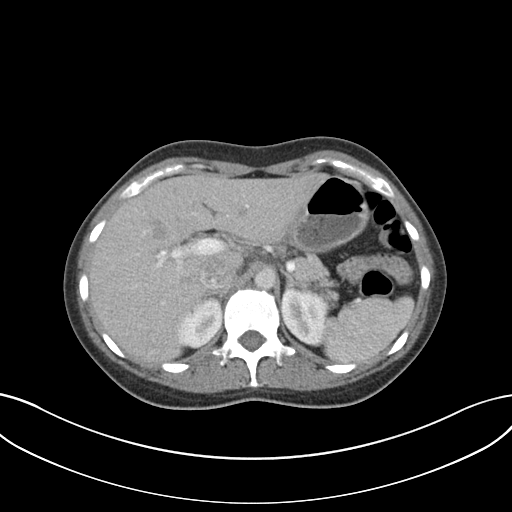
[im 80/94  soft-tissue]
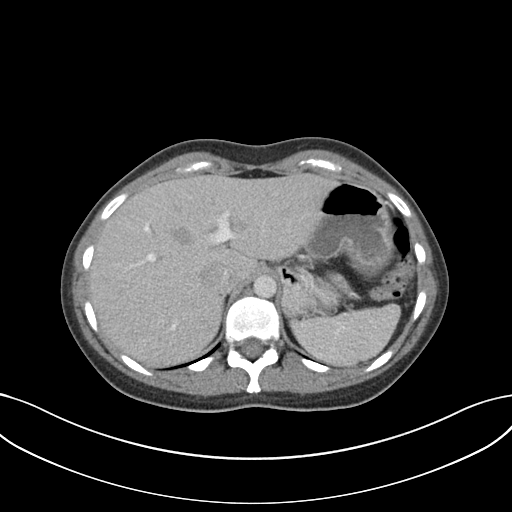
[im 89/94  soft-tissue]
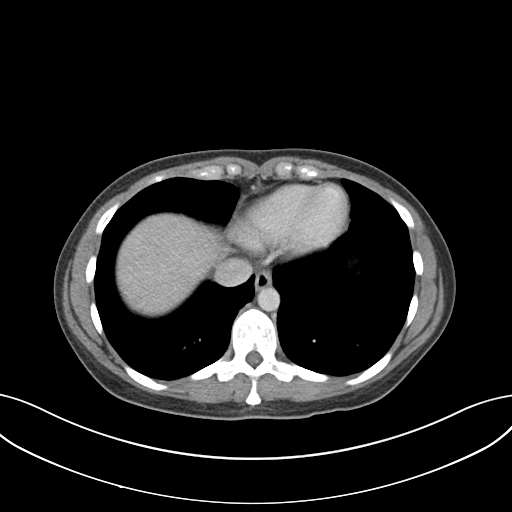

[Series 5: coronal st · coronal · 0.77mm/px · 3 of 112 slices shown]
[im 38/112  soft-tissue]
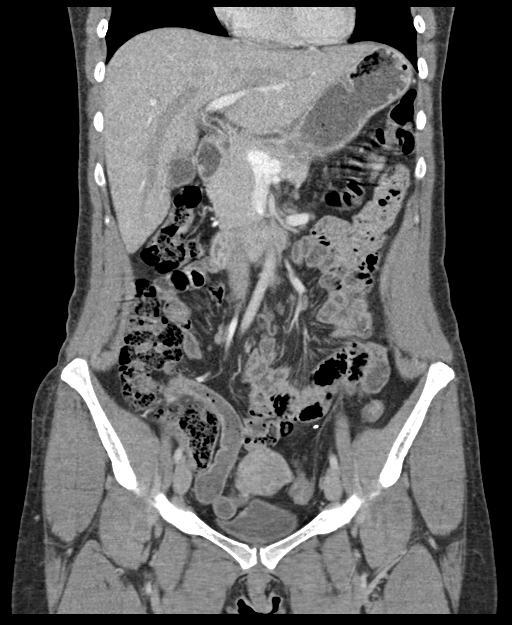
[im 50/112  soft-tissue]
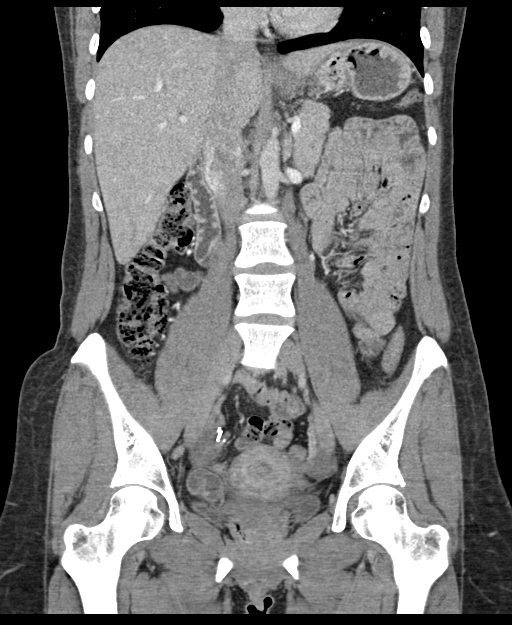
[im 62/112  soft-tissue]
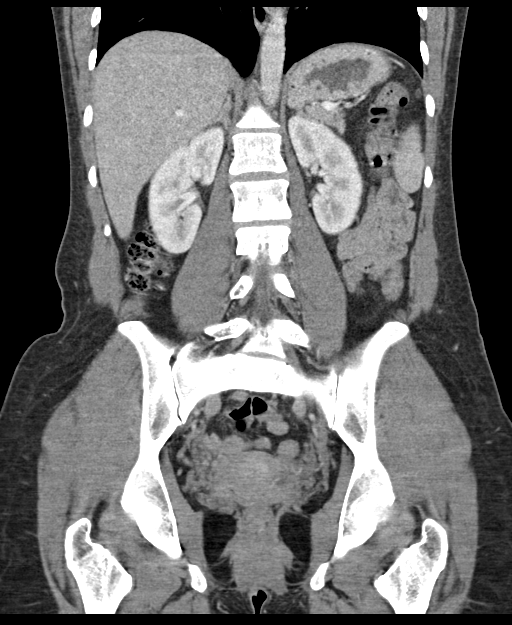

[16 of 46 positions shown; findings below may reference images not displayed]

FINDINGS: Lower chest: Negative.

Hepatobiliary: Negative liver and gallbladder.

Pancreas: Negative.

Spleen: Negative.

Adrenals/Urinary Tract: Normal adrenal glands. Symmetric and normal
renal enhancement. No nephrolithiasis or perinephric stranding.
Proximal ureters appear decompressed.

Diminutive and unremarkable urinary bladder. Scattered pelvic
phleboliths.

Stomach/Bowel: Decompressed and negative rectosigmoid colon.
Decompressed and negative descending colon. Negative transverse
colon containing gas. Mild retained stool in the right colon.

Surgical clips are located near the tip of the cecum and distal
small bowel, might be related to prior appendectomy, although there
appears to be a small normal appendix visible on coronal image 53.
No pericecal inflammation.

The terminal ileum appears normal.

No dilated or abnormal small bowel. Mostly decompressed and negative
stomach. No free air, abdominal free fluid.

Vascular/Lymphatic: Major arterial structures are patent with no
atherosclerosis. Portal venous system appears patent.

No lymphadenopathy.

Reproductive: Negative; small surgical clips in the right lower
quadrant near the right uterine fundus.

Other: Trace pelvic free fluid in the cul-de-sac.

Musculoskeletal: Negative.
IMPRESSION: Postoperative changes in the right lower quadrant adjacent to the
uterine fundus but otherwise negative CT Abdomen and Pelvis; trace
pelvic free fluid is likely physiologic.

## 2019-09-26 IMAGING — US TRANSVAGINAL ULTRASOUND OF PELVIS
1 series · 14 of 25 positions shown · non-contrast
Comparison: Pelvic ultrasound 05/25/2016

CLINICAL DATA: Right lower pain

EXAM:
TRANSABDOMINAL AND TRANSVAGINAL ULTRASOUND OF PELVIS
TECHNIQUE: Both transabdominal and transvaginal ultrasound examinations of the
pelvis were performed. Transabdominal technique was performed for
global imaging of the pelvis including uterus, ovaries, adnexal
regions, and pelvic cul-de-sac. It was necessary to proceed with
endovaginal exam following the transabdominal exam to visualize the
uterus endometrium ovaries.

[Series 1: transvaginal ultrasound of pelvis · 14 of 29 slices shown]
[im 1/29]
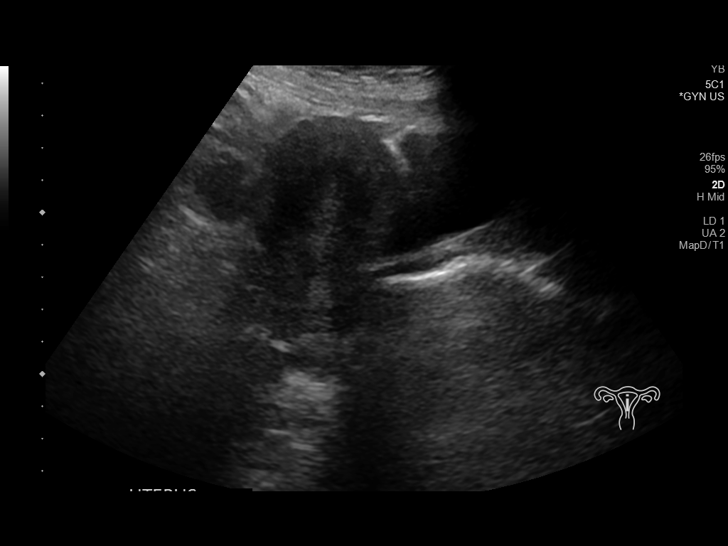
[im 3/29]
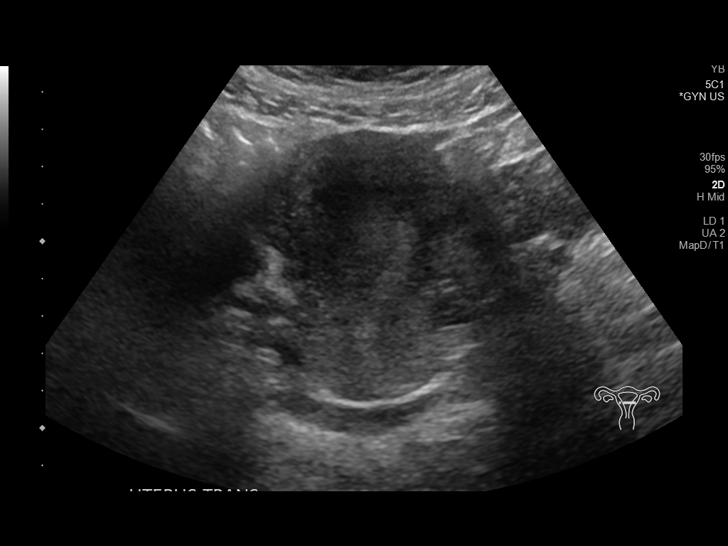
[im 5/29]
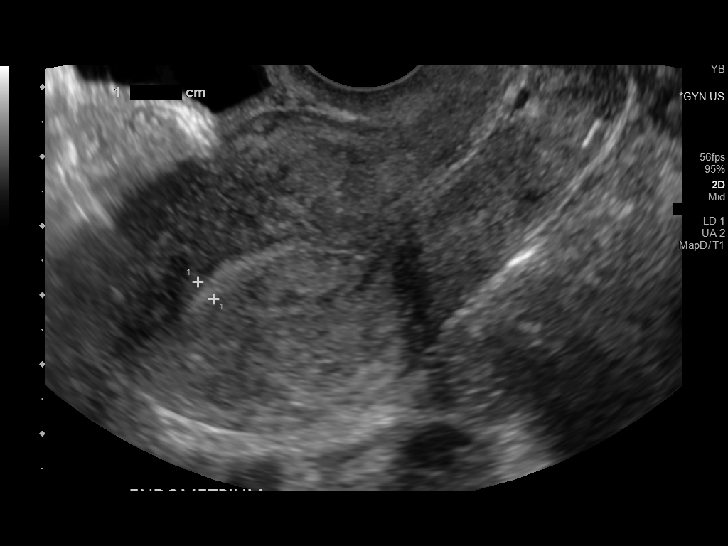
[im 8/29]
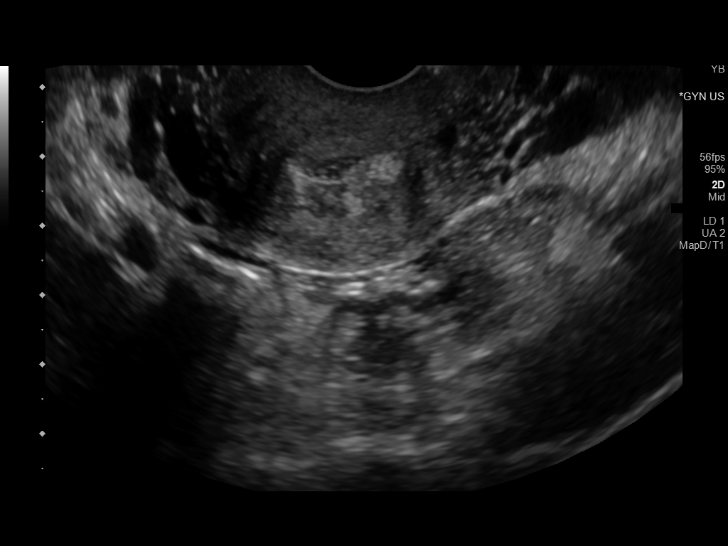
[im 10/29]
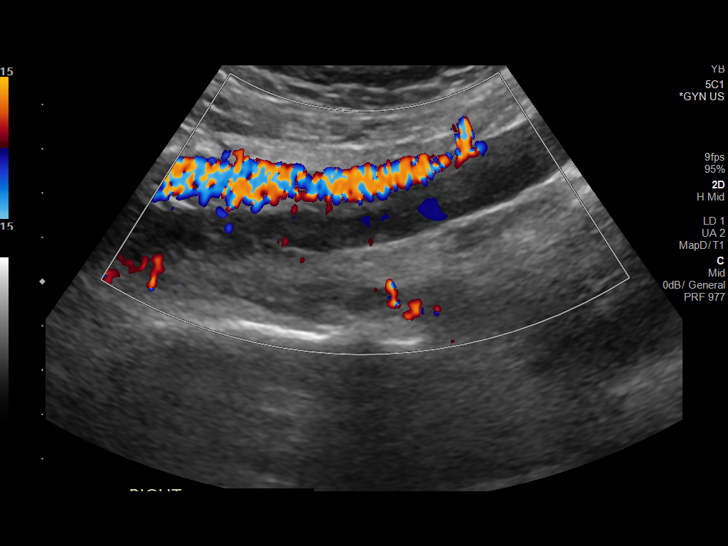
[im 11/29]
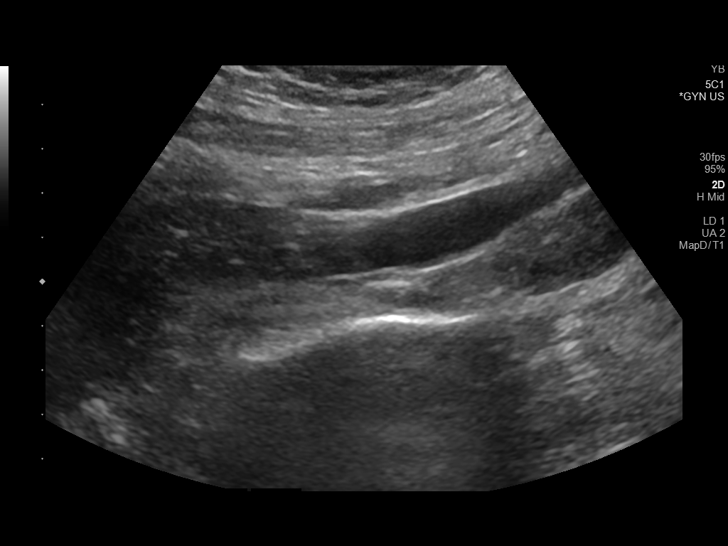
[im 13/29]
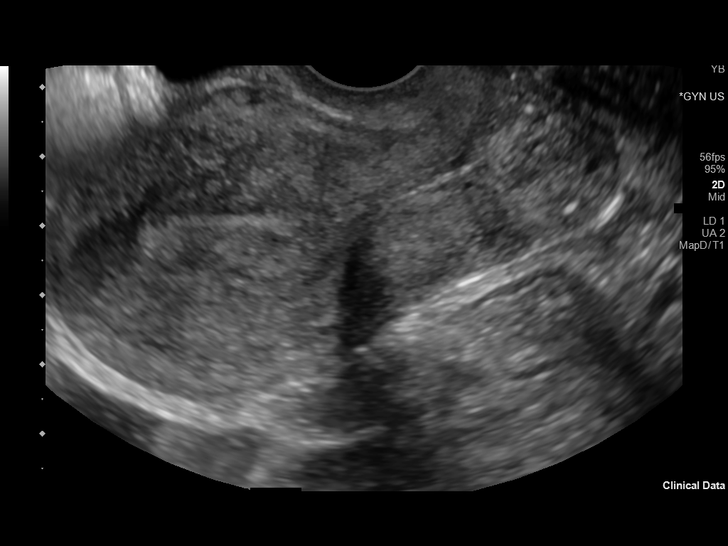
[im 16/29]
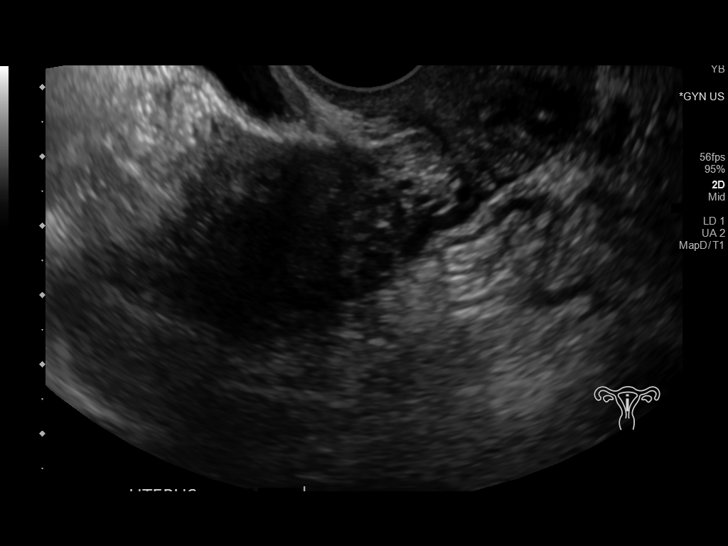
[im 18/29]
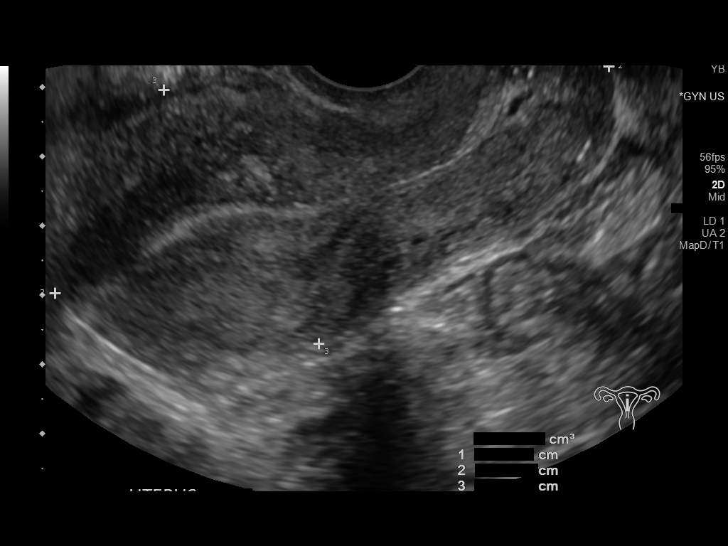
[im 19/29]
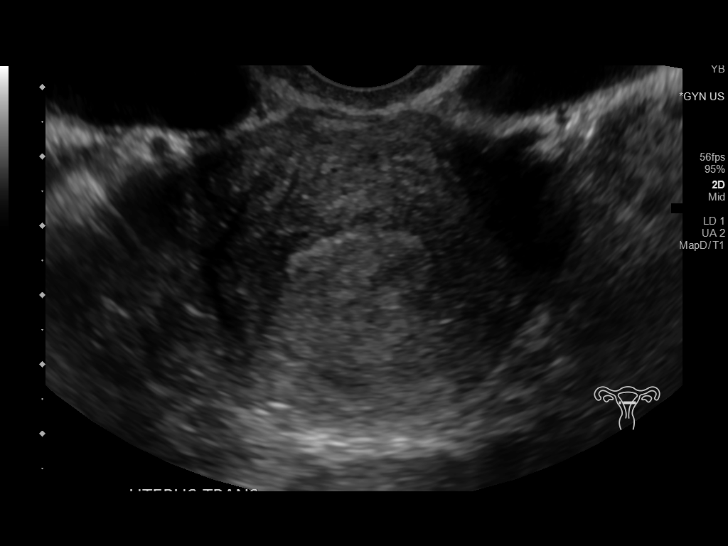
[im 22/29]
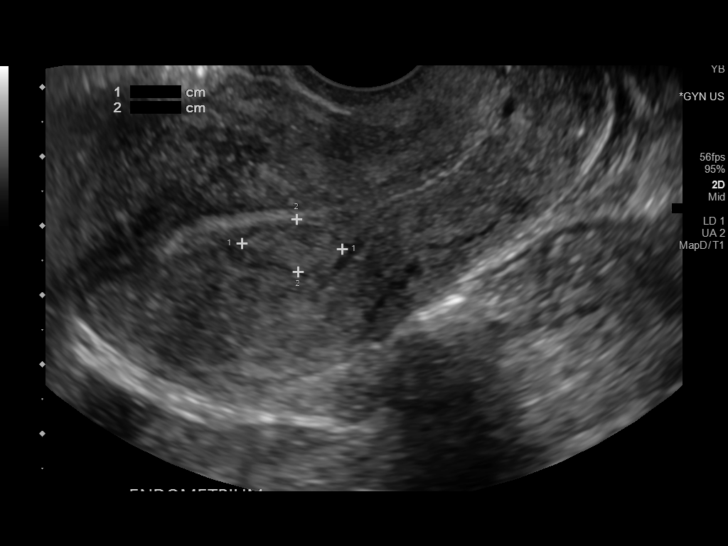
[im 24/29]
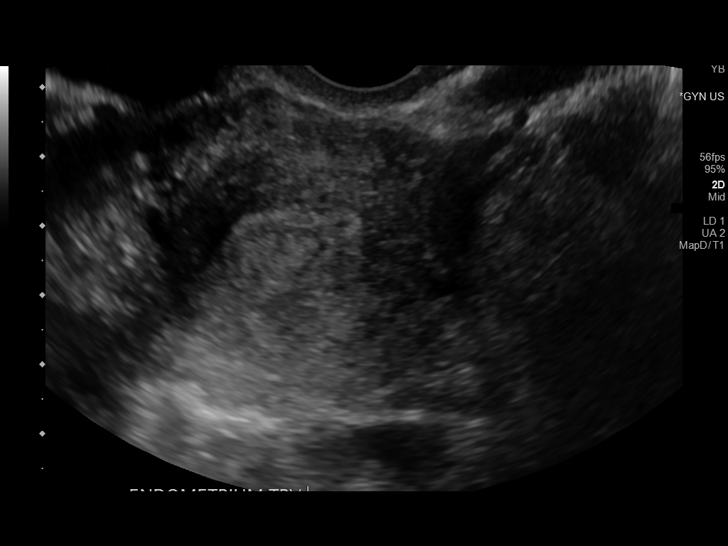
[im 26/29]
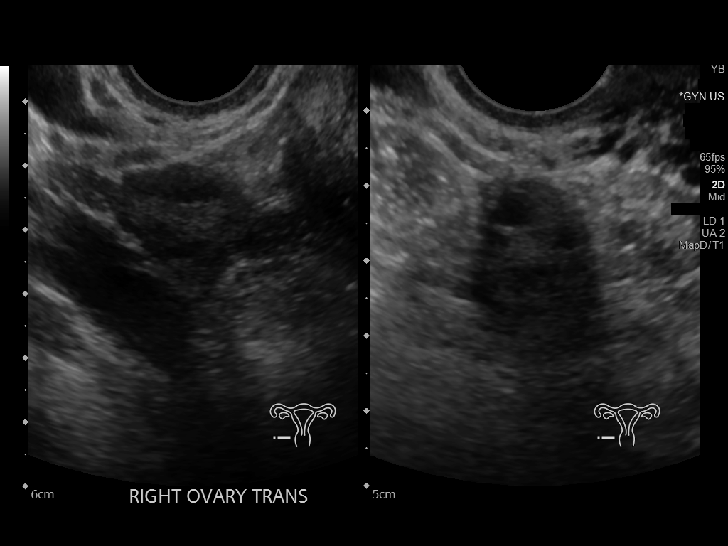
[im 29/29]
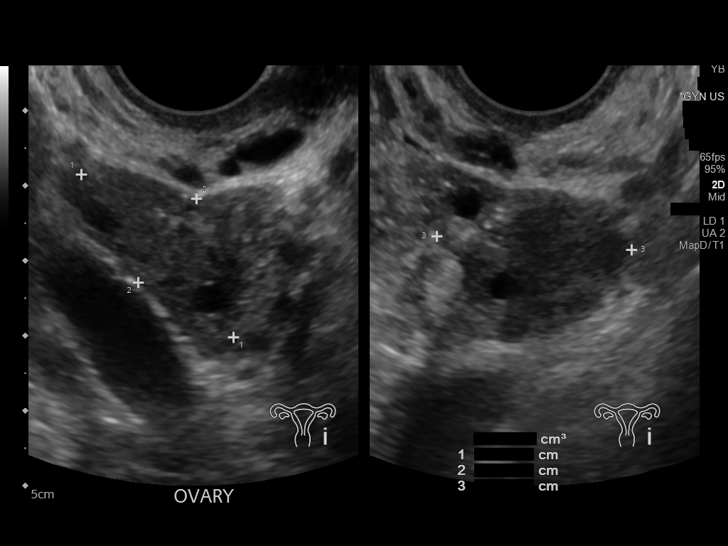

[14 of 25 positions shown; findings below may reference images not displayed]

FINDINGS: Uterus

Measurements: 8.6 x 4.5 x 4.3 cm = volume: 88 mL. Possible
submucosal fibroid within the posterior uterus measuring 1.5 x 1.8 x
2 cm.

Endometrium

Thickness: 3 mm.  No focal abnormality visualized.

Right ovary

Measurements: 2.4 x 1.6 x 2.1 cm = volume: 4.4 mL. Normal
appearance/no adnexal mass.

Left ovary

Measurements: 3 x 1.4 x 2.6 cm = volume: 5.5 mL. Normal
appearance/no adnexal mass.

Other findings

Trace free fluid
IMPRESSION: 1. Normal ultrasound appearance of the ovaries and adnexa
2. Possible small submucosal fibroid within the posterior uterus
3. Trace free fluid

## 2022-04-13 NOTE — ED Provider Notes (Signed)
Formatting of this note is different from the original.    [place]    Time of Arrival:   04/13/22 1228    Final diagnoses:   [O26.891, R10.9] Abdominal pain in pregnancy, first trimester (Primary)   [Z87.59] History of ectopic pregnancy     Medical Decision Making:      Differential Diagnosis:   Ab cramping in early preg, w hx of prior ectopic.  Consider recurrent ectopic, miscarriage, uti, round ligament pain    Social Determinants of Health:  social factors reviewed, did not limit treatment                               Supplemental Historians include:  patient    ED Course:   UPT pos, beta about 2K  CBC, BMP normal  UA without uti  Not bleeding  Formal US obtained.  On my read, there is a gest sac and yolk sac, but no fetal HR evident.  Final read confirmed.  This is diagnostic of IUP, but viability unclear.  Likely too early to know.  Advised OB follow up.        Documentation/Prior Results Review:  Nursing notes    Rhythm interpretation from monitor: N/A    Imaging Interpreted by me: [place] as above    US OB TV   Final Result     Probable 5 week 1 day IUP.     Signed By: Areatha Keas, MD on 04/13/2022 1:49 PM         .     Discussion of Mangement with other Physicians, QHP or Appropriate Source:   None    .    Disposition:  Home    Discharge Medication List as of 04/13/2022  2:13 PM       Chief Complaint   Patient presents with    ABDOMINAL PAIN (PREGNANT)     Michelle Mcintyre is a 36 y.o. female who reports being pregnant, approx 7 weeks by dates.  She comes to ed for couple days of rlq pain and cramps but no bleeding.  She has a hx of ectopic tx surgically but not sure if L or R.  She has mild vaginal discharge.  G5P3    History provided by:  Patient     Review of Systems    Physical Exam  Vitals reviewed. Exam conducted with a chaperone present.   Constitutional:       General: She is not in acute distress.     Appearance: Normal appearance. She is not toxic-appearing.    Cardiovascular:      Rate and Rhythm: Normal rate.   Pulmonary:      Effort: Pulmonary effort is normal. No respiratory distress.   Abdominal:      General: There is no distension.      Palpations: Abdomen is soft.      Tenderness: There is no abdominal tenderness. There is no guarding.   Genitourinary:     General: Normal vulva.      Vagina: Vaginal discharge present. No bleeding. vaginal discharge consistency:  Thin, vaginal discharge odor: none, vaginal discharge color:  White.       Cervix: No cervical motion tenderness or cervical bleeding.      Uterus: Enlarged.       Adnexa: Right adnexa normal and left adnexa normal.   Musculoskeletal:      Right lower leg:  No edema.      Left lower leg: No edema.   Skin:     General: Skin is warm and dry.   Neurological:      Mental Status: She is alert.     Past Medical History:   Diagnosis Date    Ectopic pregnancy     Gestational diabetes mellitus, antepartum      Past Surgical History:   Procedure Laterality Date    LAPAROTOMY      2009    SALPINGECTOMY       No family history on file.  Social History     Occupational History    Not on file   Tobacco Use    Smoking status: Former     Packs/day: 0.50     Years: 5.00     Pack years: 2.50     Types: Cigarettes    Smokeless tobacco: Never   Substance and Sexual Activity    Alcohol use: No     Alcohol/week: 2.5 standard drinks     Types: 3 Shots of liquor per week     Comment: occasional    Drug use: No    Sexual activity: Not on file     No outpatient medications have been marked as taking for the 04/13/22 encounter [facility] Encounter).     No Known Allergies    Vital Signs:  Patient Vitals for the past 72 hrs:   Temp Heart Rate Resp BP BP Mean SpO2 Weight   04/13/22 1231 98.2 F (36.8 C) 76 18 138/68 91 MM HG 99 % 76.9 kg (169 lb 8.5 oz)     Diagnostics:  Labs:    Results for orders placed or performed during the hospital encounter of 04/13/22   WET PREP    Specimen: Vaginal; Various culture specimens   Result Value Ref  Range    PMN Very Few (A) None    Motile Trichomonas None None    Yeast None None    Clue Cells Very Few (A) None   URINALYSIS POC (LAB)   Result Value Ref Range    Urine pH 6.0 5.0 - 8.0 pH    Urine Protein Screen 30* (A) Negative mg/dL    Urine Glucose Negative Negative mg/dL    Urine Ketones Negative Negative mg/dL    Urine Occult Blood Negative Negative    Urine Specific Gravity 1.025 1.003 - 1.030    Urine Nitrite Negative Negative    Urine Leukocyte Esterase Trace (A) Negative    Urine Bilirubin Negative Negative    Urine Urobilinogen 0.2 0.2 - 1.0 mg/dL   PREGNANCY URINE POC (LAB)   Result Value Ref Range    PREGNANCY URINE Positive (A) Negative   Chem8, i-STAT (Lab)   Result Value Ref Range    POTASSIUM 3.8 3.5 - 5.5 mmol/L    CHLORIDE 103 98 - 110 mmol/L    CALCIUM IONIZED 4.8 4.4 - 5.4 mg/dL    CO2 22.0 20.0 - 32.0 mmol/L    Glucose 93 70 - 99 mg/dL    BUN 8 6 - 22 mg/dL    CREATININE 0.7 0.5 - 1.2 mg/dL    SODIUM 139 133 - 145 mmol/L    HGB 13.6 11.7 - 15.5 g/dL    HCT 40.0 35.1 - 46.5 %    POC-eGFR >60.0 >60.0    Cartridge type CHEM8+    BETA HCG BLOOD (QUANTITATIVE)   Result Value Ref Range    Beta HCG 2,017  mIU/mL   CBC WITH DIFFERENTIAL AUTO   Result Value Ref Range    WBC 9.2 4.0 - 11.0 K/uL    RBC 4.69 3.80 - 5.20 M/uL    HGB 11.9 11.7 - 15.5 g/dL    HCT 37.5 35.1 - 46.5 %    MCV 80 80 - 99 fL    MCH 25 (L) 26 - 34 pg    MCHC 32 31 - 36 g/dL    RDW 16.3 (H) 10.0 - 15.5 %    Platelet 285 140 - 440 K/uL    MPV 12.0 9.0 - 13.0 fL    Segmented Neutrophils (Auto) 70 40 - 75 %    Lymphocytes (Auto) 22 20 - 45 %    Monocytes (Auto) 7 3 - 12 %    Eosinophils (Auto) 0 0 - 6 %    Basophils (Auto) 0 0 - 2 %    Absolute Neutrophils (Auto) 6.4 1.8 - 7.7 K/uL    Absolute Lymphocytes (Auto) 2.1 1.0 - 4.8 K/uL    Absolute Monocytes (Auto) 0.7 0.1 - 1.0 K/uL    Absolute Eosinophils (Auto) 0.0 0.0 - 0.5 K/uL    Absolute Basophils (Auto) 0.0 0.0 - 0.2 K/uL     ECG:  No results found for this visit on  04/13/22.    Medications ordered/given in the ED  Medications - No data to display      Electronically signed by Dillard Cannon, MD at 04/13/2022  4:02 PM EDT

## 2022-04-13 NOTE — ED Notes (Signed)
Formatting of this note might be different from the original.  Pain assessment on discharge was tolerable.  Condition stable.  Patient discharged to home.  Patient education was completed:  yes  Education taught to:  patient  Teaching method used was discussion and handout.  Understanding of teaching was good.  Patient was discharged ambulatory.  Discharged with self.  Valuables were given to: patient.    Electronically signed by Zenaida Deed, RN at 04/13/2022  2:21 PM EDT

## 2022-04-13 NOTE — ED Triage Notes (Signed)
Formatting of this note might be different from the original.  G5P3, hx of ectopic pregnancy reports RLQ abdominal pain. She believes she is approximately [redacted] weeks pregnant.   Electronically signed by Luiz Iron, RN at 04/13/2022 12:31 PM EDT

## 2022-05-28 LAB — HIV, EXTERNAL RESULT: HIV, External Result: NONREACTIVE

## 2022-05-28 LAB — RPR, EXTERNAL RESULT: RPR, External Result: NONREACTIVE

## 2022-05-28 LAB — RUBELLA TITER, EXTERNAL RESULT: Rubella Titer, External Result: IMMUNE

## 2022-05-28 LAB — HEPATITIS B, EXTERNAL RESULT: Hep B, External Result: NEGATIVE

## 2022-05-28 LAB — N. GONORRHOEAE, EXTERNAL RESULT: N. Gonorrhoeae, External Result: NEGATIVE

## 2022-05-28 LAB — C. TRACHOMATIS, EXTERNAL RESULT: C. Trachomatis, External Result: NEGATIVE

## 2022-06-01 ENCOUNTER — Inpatient Hospital Stay: Admit: 2022-06-01 | Discharge: 2022-06-01 | Disposition: A | Discharge status: Home / Self Care | Payer: MEDICAID

## 2022-06-01 ENCOUNTER — Emergency Department: Admit: 2022-06-01 | Payer: MEDICAID

## 2022-06-01 DIAGNOSIS — N939 Abnormal uterine and vaginal bleeding, unspecified: Secondary | ICD-10-CM

## 2022-06-01 DIAGNOSIS — O209 Hemorrhage in early pregnancy, unspecified: Secondary | ICD-10-CM

## 2022-06-01 LAB — CBC WITH AUTO DIFFERENTIAL
Absolute Immature Granulocyte: 0.1 10*3/uL — ABNORMAL HIGH (ref 0.00–0.04)
Basophils %: 0 % (ref 0–2)
Basophils Absolute: 0 10*3/uL (ref 0.0–0.1)
Eosinophils %: 0 % (ref 0–5)
Eosinophils Absolute: 0 10*3/uL (ref 0.0–0.4)
Hematocrit: 39.3 % (ref 35.0–45.0)
Hemoglobin: 13 g/dL (ref 12.0–16.0)
Immature Granulocytes: 0 % (ref 0.0–0.5)
Lymphocytes %: 16 % — ABNORMAL LOW (ref 21–52)
Lymphocytes Absolute: 2.1 10*3/uL (ref 0.9–3.6)
MCH: 27 PG (ref 24.0–34.0)
MCHC: 33.1 g/dL (ref 31.0–37.0)
MCV: 81.5 FL (ref 78.0–100.0)
MPV: 12.1 FL — ABNORMAL HIGH (ref 9.2–11.8)
Monocytes %: 6 % (ref 3–10)
Monocytes Absolute: 0.8 10*3/uL (ref 0.05–1.2)
Neutrophils %: 77 % — ABNORMAL HIGH (ref 40–73)
Neutrophils Absolute: 10.1 10*3/uL — ABNORMAL HIGH (ref 1.8–8.0)
Nucleated RBCs: 0 PER 100 WBC
Platelets: 208 10*3/uL (ref 135–420)
RBC: 4.82 M/uL (ref 4.20–5.30)
RDW: 17 % — ABNORMAL HIGH (ref 11.6–14.5)
WBC: 13.1 10*3/uL (ref 4.6–13.2)
nRBC: 0 10*3/uL (ref 0.00–0.01)

## 2022-06-01 LAB — COMPREHENSIVE METABOLIC PANEL
ALT: 29 U/L (ref 13–56)
AST: 25 U/L (ref 10–38)
Albumin/Globulin Ratio: 0.9 (ref 0.8–1.7)
Albumin: 3.7 g/dL (ref 3.4–5.0)
Alk Phosphatase: 79 U/L (ref 45–117)
Anion Gap: 7 mmol/L (ref 3.0–18)
BUN: 8 MG/DL (ref 7.0–18)
Bun/Cre Ratio: 13 (ref 12–20)
CO2: 25 mmol/L (ref 21–32)
Calcium: 9.1 MG/DL (ref 8.5–10.1)
Chloride: 104 mmol/L (ref 100–111)
Creatinine: 0.62 MG/DL (ref 0.6–1.3)
Est, Glom Filt Rate: >60 mL/min/{1.73_m2} (ref >60)
Globulin: 3.9 g/dL (ref 2.0–4.0)
Glucose: 71 mg/dL — ABNORMAL LOW (ref 74–99)
Potassium: 3.5 mmol/L (ref 3.5–5.5)
Sodium: 136 mmol/L (ref 136–145)
Total Bilirubin: 0.4 MG/DL (ref 0.2–1.0)
Total Protein: 7.6 g/dL (ref 6.4–8.2)

## 2022-06-01 LAB — HCG, QUANTITATIVE, PREGNANCY: hCG Quant: 50535 m[IU]/mL — ABNORMAL HIGH (ref 0–10)

## 2022-06-01 NOTE — ED Notes (Signed)
Pelvic exam completed by provider Urban Gibson PA this RN acted as a Producer, television/film/video. Lab called regarding HCG quantitative results. Per lab results will be posted soon.     Rozanna Box, RN  06/01/22 [phone]

## 2022-06-01 NOTE — ED Triage Notes (Signed)
Patient arrived with abdominal pain bleeding that started this morning . Patient [redacted] weeks pregnant.

## 2022-06-01 NOTE — ED Provider Notes (Cosign Needed)
EMERGENCY DEPARTMENT HISTORY AND PHYSICAL EXAM      Patient Name: Michelle Mcintyre  MRN: [id number]  Birth Date: 1986/06/19  Provider: Cheral Bay, PA-C  PCP: No primary care provider on file.   Time/Date of evaluation: 10:59 AM EDT on 06/01/22    History of Presenting Illness     Chief Complaint   Patient presents with    Vaginal Bleeding     12 weeks preg         History Provided By: Patient     History [name]):   Michelle Mcintyre is a 36 y.o. female with a PMHX of G5P3  who presents to the emergency department  by POV C/O vaginal bleeding.  Patient states that she is currently [redacted] weeks pregnant, and follows up with OB/GYN and is due to deliver at [facility].  States that she woke up this morning and noticed slight blood while peeing, and then has had progressive worsening, patient denies any abdominal pain nausea vomiting trauma noted to the abdomen.  Patient states that she has a history of an ectopic pregnancy, but her other pregnancies had no issues.  Past History     Past Medical History:  No past medical history on file.    Past Surgical History:  No past surgical history on file.    Family History:  No family history on file.    Social History:       Medications:  No current facility-administered medications for this encounter.     No current outpatient medications on file.       Allergies:  No Known Allergies    Social Determinants of Health:  Social Determinants of Health     Tobacco Use: Not on file   Alcohol Use: Not on file   Financial Resource Strain: Not on file   Food Insecurity: Not on file   Transportation Needs: Not on file   Physical Activity: Not on file   Stress: Not on file   Social Connections: Not on file   Intimate Partner Violence: Not on file   Depression: Not on file   Housing Stability: Not on file   Postpartum Depression: Not on file       Review of Systems   Review of Systems   Constitutional:  Negative for activity change, chills and fever.   HENT:   Negative for congestion and rhinorrhea.    Cardiovascular:  Negative for chest pain and palpitations.   Gastrointestinal:  Negative for abdominal distention, abdominal pain, diarrhea, nausea and vomiting.   Genitourinary:  Positive for vaginal bleeding. Negative for dysuria, vaginal discharge and vaginal pain.   Skin:  Negative for rash and wound.   Neurological:  Negative for headaches.      Negative except as listed above in HPI.    Physical Exam   Physical Exam  Vitals and nursing note reviewed. Exam conducted with a chaperone present.   Constitutional:       Appearance: Normal appearance.      Comments: Patient is well-appearing, resting comfortably in stretcher, no acute distress   HENT:      Head: Normocephalic and atraumatic.   Cardiovascular:      Rate and Rhythm: Normal rate and regular rhythm.      Pulses: Normal pulses.      Heart sounds: Normal heart sounds.   Pulmonary:      Effort: Pulmonary effort is normal.   Abdominal:      General:  Abdomen is flat.      Palpations: Abdomen is soft.      Comments: Abdomen is soft, nontender nondistended no reproducible pain on palpation   Genitourinary:     General: Normal vulva.      Exam position: Lithotomy position.      Pubic Area: No rash or pubic lice.       Comments: Patient's external genitalia showed no acute findings, cervical os was closed, mild dried blood with no significant pooling, patient had no cervical motion tenderness,  Musculoskeletal:      Cervical back: Normal range of motion.   Skin:     General: Skin is warm.   Neurological:      General: No focal deficit present.      Mental Status: She is alert and oriented to person, place, and time.   Psychiatric:         Mood and Affect: Mood normal.            Diagnostic Study Results     Labs:  Recent Results (from the past 12 hour(s))   CBC with Auto Differential    Collection Time: 06/01/22 10:25 AM   Result Value Ref Range    WBC 13.1 4.6 - 13.2 K/uL    RBC 4.82 4.20 - 5.30 M/uL    Hemoglobin 13.0  12.0 - 16.0 g/dL    Hematocrit 54.0 98.1 - 45.0 %    MCV 81.5 78.0 - 100.0 FL    MCH 27.0 24.0 - 34.0 PG    MCHC 33.1 31.0 - 37.0 g/dL    RDW 19.1 (H) 47.8 - 14.5 %    Platelets 208 135 - 420 K/uL    MPV 12.1 (H) 9.2 - 11.8 FL    Nucleated RBCs 0.0 0 PER 100 WBC    nRBC 0.00 0.00 - 0.01 K/uL    Neutrophils % 77 (H) 40 - 73 %    Lymphocytes % 16 (L) 21 - 52 %    Monocytes % 6 3 - 10 %    Eosinophils % 0 0 - 5 %    Basophils % 0 0 - 2 %    Immature Granulocytes 0 0.0 - 0.5 %    Neutrophils Absolute 10.1 (H) 1.8 - 8.0 K/UL    Lymphocytes Absolute 2.1 0.9 - 3.6 K/UL    Monocytes Absolute 0.8 0.05 - 1.2 K/UL    Eosinophils Absolute 0.0 0.0 - 0.4 K/UL    Basophils Absolute 0.0 0.0 - 0.1 K/UL    Absolute Immature Granulocyte 0.1 (H) 0.00 - 0.04 K/UL    Differential Type AUTOMATED     Comprehensive Metabolic Panel    Collection Time: 06/01/22 10:25 AM   Result Value Ref Range    Sodium 136 136 - 145 mmol/L    Potassium 3.5 3.5 - 5.5 mmol/L    Chloride 104 100 - 111 mmol/L    CO2 25 21 - 32 mmol/L    Anion Gap 7 3.0 - 18 mmol/L    Glucose 71 (L) 74 - 99 mg/dL    BUN 8 7.0 - 18 MG/DL    Creatinine 2.95 0.6 - 1.3 MG/DL    Bun/Cre Ratio 13 12 - 20      Est, Glom Filt Rate >60 >60 ml/min/1.69m2    Calcium 9.1 8.5 - 10.1 MG/DL    Total Bilirubin 0.4 0.2 - 1.0 MG/DL    ALT 29 13 - 56 U/L    AST 25 10 -  38 U/L    Alk Phosphatase 79 45 - 117 U/L    Total Protein 7.6 6.4 - 8.2 g/dL    Albumin 3.7 3.4 - 5.0 g/dL    Globulin 3.9 2.0 - 4.0 g/dL    Albumin/Globulin Ratio 0.9 0.8 - 1.7     HCG, Quantitative, Pregnancy    Collection Time: 06/01/22 10:25 AM   Result Value Ref Range    hCG Quant 50,535 (H) 0 - 10 MIU/ML       Radiologic Studies:   US OB LESS THAN 14 WEEKS SINGLE OR FIRST GESTATION   Final Result      Single live intrauterine gestation.  EGA is 11 w 3 d by CRL; HR is 193 /min.    EDC is 18 December 2022.      No gross fetal anomalies are seen but the anatomy is not yet well demonstrated.                EKG interpretation:  (Preliminary)    Procedures     Procedures    ED Course     10:59 AM EDT I (Alexandria Shiflett PA-C) am the first provider for this patient. Initial assessment performed. I reviewed the vital signs, available nursing notes, past medical history, past surgical history, family history and social history. The patients presenting problems have been discussed, and they are in agreement with the care plan formulated and outlined with them.  I have encouraged them to ask questions as they arise throughout their visit.    Records Reviewed: Nursing Notes and Old Medical Records    Is this patient to be included in the SEP-1 core measure? No Exclusion criteria - the patient is NOT to be included for SEP-1 Core Measure due to: Infection is not suspected    MEDICATIONS ADMINISTERED IN THE ED:  Medications - No data to display    ED Course as of 06/01/22 1643   Mon Jun 01, 2022   1205 Reevaluated patient she stated that bleeding has stopped, she still hemodynamically stable at this time, and not having any pain. [CD]      ED Course User Index  [CD] Cheral Bay, PA-C       Medical Decision Making     36 year old female G5, P3, and is currently [redacted] weeks pregnant presented ambulatory to the ED with 1 day of vaginal bleeding and cramping.  On physical exam she was well-appearing afebrile, nontachycardic, and hemodynamically stable.  CBC showed no leukocytosis, or significant 8 anemia, CMP showed no electrolyte abnormality, hCG quantitative level was 50,535 which appears to be appropriate for patient's gestation, however no comparative hCG levels.  On pelvic exam patient's cervical os was closed, with no significant bleeding, cervical motion tenderness, or significant abdominal pain.  Patient had adequate fetal heart tones, and ultrasound, showed heart rate of 193, and estimated gestation of 11 weeks and 3 days, with no gross fetal abnormalities.  At the time of discharge patient was hemodynamically stable, and was following up  with her OB/GYN in the morning.  Patient encouraged to return to ED immediately worsening or new development of symptoms.    Disposition Considerations : discharge home       Critical Care Time:         Jaxyn Rout     Diagnosis and Disposition     DIAGNOSIS:   1. Vaginal bleeding    2. [redacted] weeks gestation of pregnancy  DISPOSITION Decision To Discharge 06/01/2022 01:33:52 PM      PATIENT REFERRED TO:  [facility]  [address] [place], [address]  [place] [address]  [phone]  Go in 1 day      [facility] EMERGENCY DEPT  [address]  [place] [address]  [phone]  Go to   As needed, If symptoms worsen      DISCHARGE MEDICATIONS:  There are no discharge medications for this patient.      DISCONTINUED MEDICATIONS:  There are no discharge medications for this patient.             (Please note that portions of this note were completed with a voice recognition program.  Efforts were made to edit the dictations but occasionally words are mis-transcribed.)    Luther Parody A Laporchia Nakajima, PA-C (electronically signed)    I Genevieve Norlander , PA-C am the primary clinician of record.    Dragon Disclaimer     Please note that this dictation was completed with Dragon, the Advertising account planner.  Quite often unanticipated grammatical, syntax, homophones, and other interpretive errors are inadvertently transcribed by the computer software.  Please disregard these errors.  Please excuse any errors that have escaped final proofreading.       Cheral Bay, PA-C  06/01/22 1643

## 2022-06-01 NOTE — Discharge Instructions (Signed)
Please follow up with OBGYN in the next 24 hours return to ED immediately

## 2022-06-01 NOTE — ED Notes (Signed)
Fetal heart tones 153     Calton Dach, RN  06/01/22 1009

## 2022-06-14 ENCOUNTER — Inpatient Hospital Stay
Admit: 2022-06-14 | Discharge: 2022-06-14 | Disposition: A | Discharge status: Home / Self Care | Payer: PRIVATE HEALTH INSURANCE | Attending: Emergency Medicine

## 2022-06-14 DIAGNOSIS — O200 Threatened abortion: Secondary | ICD-10-CM

## 2022-06-14 NOTE — Discharge Instructions (Signed)
Return to the ED if you soak through a pad in less than an hour, with severe pain, fever or other new symptoms of concern. Call to schedule follow up with your OB/GYN provider as soon as possible. Pelvic rest: nothing in the vagina; no sex, no tampons, no douching until cleared by OB/GYN.

## 2022-06-14 NOTE — ED Provider Notes (Signed)
[facility]  Emergency Department Treatment Report    Patient: BRAYLON LEMMONS Age: 36 y.o. Sex: female    Date of Birth: 06/18/1986 Admit Date: 06/14/2022 PCP: PROVIDER UNKNOWN   MRN: [id number]  CSN: [id number]  ATTENDING: Gwenyth Allegra, MD    Room: ER06/ER06 Time Dictated: 4:07 PM APP: Merian Capron, PA-C     Chief Complaint   Chief Complaint   Patient presents with    Vaginal Bleeding       History of Present Illness   36 y.o. female G5 P3 approximately [redacted] weeks gestation presenting to the emergency department for evaluation of vaginal bleeding.  The patient reports that she was seen at [facility] on 06/01/2022 when she experienced some vaginal bleeding.  She had an evaluation including an ultrasound which showed an intrauterine pregnancy, she was discharged home on bedrest, she was seen by her primary OB/GYN team 2 days later and she states that the bleeding had stopped at that point.  She went back to work 4 days ago and started having some light spotting again, described as dark red blood, scant amount until this morning when she stood up and felt a gush of blood. Reports mild suprapubic cramping. She does have a history of a right ectopic pregnancy believes she had a salpingectomy.     Review of Systems   See HPI    Past Medical/Surgical History   No past medical history on file.  No past surgical history on file.  PMH: none  PSH: salpingectomy, right  Social History     Social History     Socioeconomic History    Marital status: Single     Non smoker  Denies alcohol or illicit drug use  Family History   No family history on file.  No pertinent family history.   Current Medications     There are no discharge medications for this patient.        Allergies   Patient has no known allergies.      Physical Exam     ED Triage Vitals [06/14/22 0712]   BP Temp Temp Source Pulse Respirations SpO2 Height Weight - Scale   (!) 144/82 98.6 F (37 C) Oral 86 18 100 % 1.676 m 79.8 kg          Constitutional: Patient appears well developed and well nourished. Appearance and behavior are age and situation appropriate.  HEENT: Conjunctiva clear.  Mucous membranes moist, non-erythematous. Surface of the pharynx, palate, and tongue are pink, moist and without lesions.  Respiratory: lungs clear to auscultation, nonlabored respirations. No tachypnea or accessory muscle use.  Cardiovascular: heart regular rate and rhythm without murmur rubs or gallops.     Gastrointestinal:  Soft, non-tender, non-distended, normoactive bowel sounds  Genitalia: External genitalia without swelling, lesions, or discharge. Vaginal walls have no bulging or lesions. Cervix pink with no lesions, scant dark red blood noted in vault, os is closed. No pain on cervical motion. Uterus movable without mass or tenderness. No adnexal masses noted, no adnexal tenderness.  Musculoskeletal: Calves soft and non-tender. No peripheral edema or significant varicosities.  Pulses:  radial pulses 2+ and equal bilaterally.   Integumentary: warm and dry, no jaundice, no rashes or lesions  Neurologic: alert and oriented, no focal weakness.     Impression and Management Plan   36 year old female G5, P3 approximately [redacted] weeks gestation presenting to the emergency department for evaluation of vaginal bleeding.  On exam, patient  is nontoxic-appearing, her blood pressure is mildly elevated 144/82 with her vitals are within normal limits, abdomen is soft and nontender to palpation, pelvic exam reveals a small amount of dark red blood in the vault, cervical os is closed.  I assessed the patient to the bedside with fetal Doppler noting fetal heart rate in the 150s.  I reviewed the chart and note that the patient's blood type is B+.  Will advise for pelvic rest, discharged to follow-up outpatient with OB/GYN.  DDx: Including but not limited to threatened miscarriage, subchorionic hematoma  Diagnostic Studies   No results found for any visits on  06/14/22.    ED Course/Medical Decision Making   As above, patient with reassuring fetal heart tones noted by Doppler, no significant bleeding observed, the cervical os is closed, she does not require RhoGAM because she is Rh+.  She has had an intrauterine pregnancy identified on ultrasound no reason to suspect ectopic or heterotopic pregnancy.  I think that she can be safely discharged home for continued pelvic rest with close OB/GYN follow-up and she is comfortable with this plan.  We discussed reasons for return, her vital signs remained stable during her time in the ED and she was discharged in good condition.        RECORDS REVIEWED:  I reviewed the patient's previous records here at [facility] and available outside facilities and note that patient was seen at [facility] on 06/01/2022 for vaginal bleeding.  She had an evaluation including an ultrasound, see below.    EXTERNAL RESULTS REVIEWED: Patient had an ultrasound performed at an outside facility dated 06/01/2022 which showed a single live intrauterine gestation with estimated gestational age [redacted] weeks 3 days by crown-rump length, had a heart rate of 193 at that time.  Placenta not well developed on that study.  Blood type B+.    INDEPENDENT HISTORIAN:  History and/or plan development assisted by: None    Severe exacerbation or progression of chronic illness: None      Threat to body function without evaluation and management: Reproductive      SOCIAL DETERMINANTS  impacting Evaluation and Management: Provider availability, stress, health literacy      Comorbidities impacting Evaluation and Management: None    Medications - No data to display  Final Diagnosis     1. Threatened miscarriage        Disposition   Discharged home    [place], [place]  June 14, 2022      The patient was personally evaluated by myself and discussed with Serita Sheller, MD who agrees with the above assessment and plan.    My signature above authenticates this document and  my orders, the final diagnosis (es), discharge prescription (s), and instructions in the Epic record. If you have any questions please contact [phone].     Nursing notes have been reviewed by the physician/ advanced practice clinician.    Dragon medical dictation software was used for portions of this report. Unintended voice recognition errors may occur.                           [place], [place]  06/14/22 [phone]

## 2022-06-14 NOTE — ED Triage Notes (Signed)
Patient arrives in triage with complaint of vaginal bleeding. She is [redacted] weeks pregnant and states she has been bleeding for about two weeks.     She was seen at [facility] view on the 18th for a sudden gush of blood when she woke up and was d/c to follow up with obgyn and on bed rest.     States she had the same thing happen this morning.     Her next obgyn appointment is on the 11 th

## 2022-09-11 NOTE — ED Triage Notes (Signed)
Formatting of this note might be different from the original.    Flu symptoms present: cough, sore throat, and URI  Precautions initiated: standard precautions with respiratory protection and pt regular isolation mask  Employee Precautions: regular isolation mask and good hand hygiene    Pt is [redacted] weeks pregnant and symptoms started 2 days ago      Electronically signed by Brenton Grills, RN at 09/11/2022 12:12 PM EST

## 2022-09-11 NOTE — ED Provider Notes (Signed)
Formatting of this note is different from the original.  ED COVID SCREENING NOTE    [facility]    Time of Arrival:   09/11/22 1210    Final diagnoses:   [B34.9] Viral illness (Primary)   [Z3A.28] [redacted] weeks gestation of pregnancy       Assessment/Plan:  Suspected COVID 19 infection. Differential considerations of strep, flu, and other viral illness were considered, tested for when appropriate, and thought to be less likely. Symptomatic care and return precautions discussed.      Discharge Medication List as of 09/11/2022  2:24 PM       START taking these medications    Details   ondansetron (ZOFRAN) 4 mg PO ODT. Take 1 Tab by Mouth Every 8 Hours As Needed for Nausea., Disp-20 Tab, R-0, Normal         Chief Complaint:  URI symptoms.  Concerned for COVID infection     HPI:  Michelle Mcintyre is a 36 y.o. female who presents with: congestion, sinus pressure, myalgias, cough , fever, nausea, and fatigue/tiredness.  Patient has past medical history significant for: none.  Patient reports: no history of known COVID exposure.   Patient reports no smoking.  Patient states she is currently [redacted] weeks pregnant, is feeling fetal movement.  Denies any abdominal pain or cramping.  Denies urinary complaints, vaginal bleeding or discharge. States this pregnancy has been uncomplicated.    ROS: Denies chest pain, shortness of breath, abdominal pain, vaginal bleeding, headache, neck stiffness.    Past/Family/Social History:  Past Medical History:   Diagnosis Date    Ectopic pregnancy     Gestational diabetes mellitus, antepartum      Past Surgical History:   Procedure Laterality Date    LAPAROTOMY      2009    SALPINGECTOMY       Social History     Tobacco Use    Smoking status: Former     Packs/day: 0.50     Years: 5.00     Additional pack years: 0.00     Total pack years: 2.50     Types: Cigarettes    Smokeless tobacco: Never   Vaping Use    Vaping Use: Never used   Substance Use Topics    Alcohol  use: No     Alcohol/week: 2.5 standard drinks of alcohol     Types: 3 Shots of liquor per week     Comment: occasional    Drug use: No     Outpatient Medications Marked as Taking for the 09/11/22 encounter [facility] Encounter)   Medication Sig Dispense Refill    ondansetron (ZOFRAN) 4 mg PO ODT. Take 1 Tab by Mouth Every 8 Hours As Needed for Nausea. 20 Tab 0     No Known Allergies    Exam:   Vital Signs:  Patient Vitals for the past 24 hrs:   Temp Heart Rate Resp BP BP Mean SpO2 Weight   09/11/22 1212 97.4 F (36.3 C) 92 16 163/74 (!) 104 MM HG 98 % 92.5 kg (204 lb)     General:  Nontoxic, alert and appropriate  Neck: supple, full ROM, no stiffness, and no meningeal signs  HEENT:  erythema, no exudate, no PTA; clear rhinorrhea, TMs pearly gray bilaterally  Lungs:  CTA, no tachypnea, and no respiratory distress  Cor:  RRR and not tachycardic  Abd: Gravid abdomen, nontender; FHR 150 documented per nurse via Doppler  Skin:  no rash  or lesions found  Legs:  no edema  Neuro:  no focal deficits  Psych:  at patient's baseline    Diagnostics:  Labs:    Results for orders placed or performed during the hospital encounter of 09/11/22   STREP A SCREEN    Specimen: Throat; Various culture specimens   Result Value Ref Range    STREP A SCREEN Negative Negative Positive/Negative     X-Ray:  No orders to display     The differential diagnosis and / or critical care lists were considered including infections, sepsis, severe sepsis, and septic shock and found unlikely unless otherwise documented in the final clinical impression or diagnosis list.     Electronically signed by Corinna Gab, MD at 09/12/2022  3:59 PM EST

## 2022-09-11 NOTE — ED Notes (Signed)
Formatting of this note might be different from the original.  Pain assessment on discharge was 0.  Condition stable.  Patient discharged to home.  Patient education was completed:  yes  Education taught to:  patient  Teaching method used was discussion and handout.  Understanding of teaching was good.  Patient was discharged ambulatory.  Discharged with self.  Valuables were given to: patient.    Electronically signed by Joanne Chars, RN at 09/11/2022  2:27 PM EST

## 2022-09-11 NOTE — ED Notes (Signed)
Formatting of this note might be different from the original.  FHT 150 bpm  Electronically signed by Joanne Chars, RN at 09/11/2022  2:14 PM EST

## 2022-09-12 DIAGNOSIS — O368120 Decreased fetal movements, second trimester, not applicable or unspecified: Secondary | ICD-10-CM

## 2022-09-12 NOTE — Discharge Instructions (Signed)
Home Undelivered Discharge Instructions    After Discharge Orders:    No future appointments.    Call physician or [place] office for further instructions. Collect medication in pharmacy.              Diet:  normal diet as tolerated, clear liquids, and plenty of fluids    Rest: normal activity as tolerated    Other instructions: Do kick counts once a day on your baby. Choose the time of day your baby is most active. Get in a comfortable lying or sitting position and time how long it takes to feel 10 kicks, twists, turns, swishes, or rolls. Call your physician or midwife if there have not been 10 kicks in 2 hours    Call physician or midwife, return to Labor and Delivery, call 911, or go to the nearest Emergency Room if: decreased fetal movement

## 2022-09-12 NOTE — ED Notes (Addendum)
Patient arrived on unit with a history of flu like symptoms (congestion, coughing with mucous, fever of 101 F yesterday, projectile vomiting 3-4 times today, diarrhea yesterday twice.  Decreased fetal movement less than what she felt night before. She also stated that she did a flu test yesterday but was not able to access her portal.     2225 - Patient stated that she feels a little better but still has the headache.    2300 - Patient stated that she feels better. MD in room at this time. Patient discharged with instructions.

## 2022-09-12 NOTE — Procedures (Signed)
[place]    NST Procedure Note    Patient: SUKARI GRIST Age: 36 y.o. Sex: female    Date of Birth: 10/15/1985 Admit Date: 09/12/2022 PCP: Unknown, Provider, DO   MRN: [id number]  CSN: [id number]  LOS: 0      Estimated Gestational Age: 102w0d     Indication for NST: decreased fetal movement and Influenza    10 x 10    Fetal Vital Signs:  Mode: External  Fetal Heart Rate: 150  Fetal Activity: Present  Variability: Moderate 6-25 bmp  Accelerations: Yes  Decelerations: No  FHR Interpretations: Category I    Non-Stress Test: reactive    Uterine Activity: None      Signed XL:[id number] Benard Halsted, MD  09/12/2022 11:12 PM

## 2022-09-12 NOTE — Progress Notes (Signed)
[facility]  Obstetrics Emergency Department Treatment Note        Patient: Michelle Mcintyre Age: 36 y.o. Sex: female    Date of Birth: 1986/08/11 Admit Date: 09/12/2022 PCP: Unknown, Provider, DO   MRN: [id number]  CSN: [id number]     Room: 3115/3115 Time Dictated: 11:06 PM        Chief Complaint   Chief Complaint   Patient presents with    Decreased Fetal Movement    Nausea & Vomiting    Diarrhea        History of Present Illness   36 y.o. G5P3 at 27.0 wks. She presents for decreased fetal movements. She says that she has been feeling sick, and says yesterday she felt bad but today is worse. She says that she had a fever, chills N/V, with a cough, along with muscle aches.      Primary OB:  [facility]      PNC:  UNREMARKABLE    PNL:  Blood Type: B+  Ab screen: Negative  GBS: Not done  Rubella Immune  HBsAg negative  GC/CT negative  HIV negative  RPR negative  1 hour GTT Not done    OB History   Gravida Para Term Preterm AB Living   5 3 3   1 2    SAB IAB Ectopic Molar Multiple Live Births       1     2      # Outcome Date GA Lbr Len/2nd Weight Sex Delivery Anes PTL Lv   5 Current            4 Term 06/11/11 [redacted]w[redacted]d  3.289 kg (7 lb 4 oz) F Vag-Spont EPI  LIV   3 Term 02/07/10 [redacted]w[redacted]d  2.863 kg (6 lb 5 oz) M Vag-Spont  N LIV   2 Term 10/05/08 [redacted]w[redacted]d  2.353 kg (5 lb 3 oz) M Vag-Spont EPI N       Complications: Oligohydramnios   1 Ectopic 2009                Review of Systems   Review of Systems   Constitutional:  Positive for fatigue.   HENT:  Positive for congestion.    Respiratory:  Positive for cough and shortness of breath.    Cardiovascular: Negative.    Gastrointestinal:  Positive for nausea and vomiting.   Endocrine: Negative.    Genitourinary: Negative.    Musculoskeletal:  Positive for myalgias.   Neurological: Negative.    Hematological: Negative.    Psychiatric/Behavioral: Negative.          Past Medical/Surgical History   History reviewed. No pertinent past medical history.  Past Surgical History:    Procedure Laterality Date    ECTOPIC PREGNANCY SURGERY  2009       Social History     Social History     Socioeconomic History    Marital status: Married     Spouse name: Not on file    Number of children: Not on file    Years of education: Not on file    Highest education level: Not on file   Occupational History    Not on file   Tobacco Use    Smoking status: Former     Types: Cigarettes    Smokeless tobacco: Former   Substance and Sexual Activity    Alcohol use: Not Currently    Drug use: Not on file    Sexual activity: Yes  Other Topics Concern    Not on file   Social History Narrative    Not on file     Social Determinants of Health     Financial Resource Strain: Not on file   Food Insecurity: Not on file   Transportation Needs: Not on file   Physical Activity: Not on file   Stress: Not on file   Social Connections: Not on file   Intimate Partner Violence: Not on file   Housing Stability: Not on file       Family History   History reviewed. No pertinent family history.    Current Medications     None       Allergies   No Known Allergies    Physical Exam   @VS12 @  @EDTRIAGEVITALSNEW @  Physical Exam  Constitutional:       Appearance: She is ill-appearing.   HENT:      Head: Normocephalic and atraumatic.      Nose: Congestion present.   Abdominal:      General: Bowel sounds are normal.   Musculoskeletal:         General: Normal range of motion.      Cervical back: Normal range of motion and neck supple.   Neurological:      General: No focal deficit present.      Mental Status: She is alert and oriented to person, place, and time.   Psychiatric:         Mood and Affect: Mood normal.           OB Exam     Fetal Assessment: 170s Moderate Variability  Toco:  No contraction          Impression and Management Plan   36 y/o [id number] with decreased fetal movement and URI  - IV Fluids  - Labs and Influenzae A/B, Covid 19 and RSV swab sent  - IV Tylenol and Zofran for symptoms  - Reassess after labs  resulted.        Sigmund HazelMichael Chrisa Hassan, MD, [facility]  [facility]  September 12, 2022  11:06 PM    Diagnostic Studies   Lab:   Recent Results (from the past 12 hour(s))   Urine Drug Screen    Collection Time: 09/12/22  8:40 PM   Result Value Ref Range    Amphetamine NEGATIVE NEGATIVE      Barbiturates, Urine NEGATIVE NEGATIVE      Benzodiazepines, Urine NEGATIVE NEGATIVE      Cocaine NEGATIVE NEGATIVE      Marijuana POSITIVE (A) NEGATIVE      Methadone Screen, Urine NEGATIVE NEGATIVE      Opiates, Urine NEGATIVE NEGATIVE      Phencyclidine NEGATIVE NEGATIVE     Urinalysis    Collection Time: 09/12/22  8:40 PM   Result Value Ref Range    Color, UA Yellow (A) YELLOW,STRAW      Clarity, UA Clear (A) CLEAR      Glucose, Ur Negative NEGATIVE,Negative mg/dl    Bilirubin, Urine Negative NEGATIVE,Negative      Ketones, Urine Negative NEGATIVE,Negative mg/dl    Specific Gravity, Urine 1.025 1.005 - 1.030      Blood, Urine Negative NEGATIVE,Negative      pH, Urine 7.0 5.0 - 9.0      Protein, Urine 30 mg/dL (A) NEGATIVE,Negative mg/dl    Urobilinogen, Urine 0.2 E.U./dL 0.0 - 1.0 mg/dl    Nitrite, Urine Negative NEGATIVE,Negative      Leukocyte Esterase, Urine  Small (A) NEGATIVE,Negative     COVID-19, Flu A/B, and RSV Combo    Collection Time: 09/12/22  8:40 PM    Specimen: Nasopharyngeal Swab   Result Value Ref Range    SARS-CoV-2 NEGATIVE NEGATIVE      Influenza A H1 (Seasonal) PCR POSITIVE (A) NEGATIVE      Influenza virus B RNA NEGATIVE NEGATIVE      RSV A/B PCR NEGATIVE NEGATIVE     Microscopic Urinalysis    Collection Time: 09/12/22  8:40 PM   Result Value Ref Range    Squam Epithel, UA 10-14 /LPF    WBC, UA 5-9 /HPF    MUCUS, URINE PRESENT      BACTERIA, URINE OCCASIONAL /HPF   CBC    Collection Time: 09/12/22  9:00 PM   Result Value Ref Range    WBC 11.5 (H) 4.0 - 11.0 1000/mm3    RBC 4.13 3.60 - 5.20 M/uL    Hemoglobin 12.0 11.0 - 16.0 gm/dl    Hematocrit 36.2 35.0 - 47.0 %    MCV 87.7 80.0 - 98.0 fL    MCH 29.1 25.4 -  34.6 pg    MCHC 33.1 30.0 - 36.0 gm/dl    Platelets 197 140 - 450 1000/mm3    MPV 12.1 (H) 6.0 - 10.0 fL    RDW 42.8 36.4 - 46.3     Comprehensive Metabolic Panel    Collection Time: 09/12/22  9:00 PM   Result Value Ref Range    Potassium 3.6 3.5 - 5.1 mEq/L    Chloride 104 98 - 107 mEq/L    Sodium 135 (L) 136 - 145 mEq/L    CO2 21 20 - 31 mEq/L    Glucose 90 74 - 106 mg/dl    BUN <5 (L) 9 - 23 mg/dl    Creatinine 0.60 0.55 - 1.02 mg/dl    GFR African American >60.0      GFR Non-African American >60      Calcium 9.2 8.7 - 10.4 mg/dl    Anion Gap 10 5 - 15 mmol/L    AST 26.0 0.0 - 33.9 U/L    ALT 16 10 - 49 U/L    Alkaline Phosphatase 101 46 - 116 U/L    Total Bilirubin 0.30 0.30 - 1.20 mg/dl    Total Protein 7.0 5.7 - 8.2 gm/dl    Albumin 3.0 (L) 3.4 - 5.0 gm/dl     Labs Reviewed   COVID-19, FLU A/B, AND RSV COMBO - Abnormal; Notable for the following components:       Result Value    Influenza A H1 (Seasonal) PCR POSITIVE (*)     All other components within normal limits    Narrative:     Is this test for diagnosis or screening?->Diagnosis of ill patient  Symptomatic for COVID-19 as defined by CDC?->Unknown  Date of Symptom Onset->N/A  Hospitalized for COVID-19?->No  Admitted to ICU for COVID-19?->No  Pregnant?->Yes   URINE DRUG SCREEN - Abnormal; Notable for the following components:    Marijuana POSITIVE (*)     All other components within normal limits   URINALYSIS - Abnormal; Notable for the following components:    Color, UA Yellow (*)     Clarity, UA Clear (*)     Protein, Urine 30 mg/dL (*)     Leukocyte Esterase, Urine Small (*)     All other components within normal limits   CBC - Abnormal; Notable for the following components:  WBC 11.5 (*)     MPV 12.1 (*)     All other components within normal limits   COMPREHENSIVE METABOLIC PANEL - Abnormal; Notable for the following components:    Sodium 135 (*)     BUN <5 (*)     Albumin 3.0 (*)     All other components within normal limits   MICROSCOPIC  URINALYSIS       I    ED Course   Baby improved with IV fluids  Pt improved with IV Fluids, Tamiflu           Medications   lactated ringers IV soln infusion (0 mLs IntraVENous Stopped 09/12/22 2300)   oseltamivir (TAMIFLU) capsule 75 mg (75 mg Oral Given 09/12/22 2224)   ondansetron (ZOFRAN-ODT) disintegrating tablet 4 mg (has no administration in time range)   acetaminophen (OFIRMEV) infusion 1,000 mg (0 mg IntraVENous Stopped 09/12/22 2130)   ondansetron (ZOFRAN) injection 4 mg (4 mg IntraVENous Given 09/12/22 2116)       Medical Decision Making   Discharge home    Final Diagnosis   36 y/o [id number] @ 27.0 wks with Influenza A      Disposition   Discharge home case d/w Dr Lorenso Courier  Rx for Tamiflu and Zofran given  Pt encouraged to increase PO hydration and tylenol  Pt has F/U visit this week                 My signature above authenticates this document and my orders, the final    diagnosis(es), discharge prescription(s), and instructions in the Epic    record.  If you have any questions please contact [phone].  Nursing notes have been reviewed by myself.

## 2022-09-13 ENCOUNTER — Inpatient Hospital Stay
Admit: 2022-09-13 | Discharge: 2022-09-13 | Disposition: A | Discharge status: Home / Self Care | Payer: PRIVATE HEALTH INSURANCE

## 2022-09-13 LAB — URINE DRUG SCREEN
Amphetamine: NEGATIVE
Barbiturates, Urine: NEGATIVE
Benzodiazepines, Urine: NEGATIVE
Cocaine: NEGATIVE
Marijuana: POSITIVE — AB
Methadone Screen, Urine: NEGATIVE
Opiates, Urine: NEGATIVE
Phencyclidine: NEGATIVE

## 2022-09-13 LAB — CBC
Hematocrit: 36.2 % (ref 35.0–47.0)
Hemoglobin: 12 gm/dl (ref 11.0–16.0)
MCH: 29.1 pg (ref 25.4–34.6)
MCHC: 33.1 gm/dl (ref 30.0–36.0)
MCV: 87.7 fL (ref 80.0–98.0)
MPV: 12.1 fL — ABNORMAL HIGH (ref 6.0–10.0)
Platelets: 197 10*3/uL (ref 140–450)
RBC: 4.13 M/uL (ref 3.60–5.20)
RDW: 42.8 (ref 36.4–46.3)
WBC: 11.5 10*3/uL — ABNORMAL HIGH (ref 4.0–11.0)

## 2022-09-13 LAB — COMPREHENSIVE METABOLIC PANEL
ALT: 16 U/L (ref 10–49)
AST: 26 U/L (ref 0.0–33.9)
Albumin: 3 gm/dl — ABNORMAL LOW (ref 3.4–5.0)
Alkaline Phosphatase: 101 U/L (ref 46–116)
Anion Gap: 10 mmol/L (ref 5–15)
BUN: <5 mg/dl — ABNORMAL LOW (ref 9–23)
CO2: 21 mEq/L (ref 20–31)
Calcium: 9.2 mg/dl (ref 8.7–10.4)
Chloride: 104 mEq/L (ref 98–107)
Creatinine: 0.6 mg/dl (ref 0.55–1.02)
GFR African American: >60
GFR Non-African American: >60
Glucose: 90 mg/dl (ref 74–106)
Potassium: 3.6 mEq/L (ref 3.5–5.1)
Sodium: 135 mEq/L — ABNORMAL LOW (ref 136–145)
Total Bilirubin: 0.3 mg/dl (ref 0.30–1.20)
Total Protein: 7 gm/dl (ref 5.7–8.2)

## 2022-09-13 LAB — URINALYSIS
Bilirubin, Urine: NEGATIVE
Blood, Urine: NEGATIVE
Glucose, Ur: NEGATIVE mg/dl
Ketones, Urine: NEGATIVE mg/dl
Nitrite, Urine: NEGATIVE
Protein, Urine: 30 mg/dl — AB
Specific Gravity, Urine: 1.025 (ref 1.005–1.030)
Urobilinogen, Urine: 0.2 mg/dl (ref 0.0–1.0)
pH, Urine: 7 (ref 5.0–9.0)

## 2022-09-13 LAB — COVID-19, FLU A/B, AND RSV COMBO
Influenza A H1 (Seasonal) PCR: POSITIVE — AB
Influenza virus B RNA: NEGATIVE
RSV A/B PCR: NEGATIVE
SARS-CoV-2: NEGATIVE

## 2022-09-13 LAB — MICROSCOPIC URINALYSIS

## 2022-09-13 MED ORDER — OSELTAMIVIR PHOSPHATE 75 MG PO CAPS
75 MG | Freq: Two times a day (BID) | ORAL | Status: DC
Start: 2022-09-13 — End: 2022-09-13
  Administered 2022-09-13: 03:00:00 75 mg via ORAL

## 2022-09-13 MED ORDER — ACETAMINOPHEN 10 MG/ML IV SOLN
10 MG/ML | Freq: Once | INTRAVENOUS | Status: AC
Start: 2022-09-13 — End: 2022-09-12
  Administered 2022-09-13: 02:00:00 1000 mg via INTRAVENOUS

## 2022-09-13 MED ORDER — ONDANSETRON 4 MG PO TBDP
4 MG | Freq: Three times a day (TID) | ORAL | Status: DC | PRN
Start: 2022-09-13 — End: 2022-09-13

## 2022-09-13 MED ORDER — LACTATED RINGERS IV SOLN
INTRAVENOUS | Status: DC
Start: 2022-09-13 — End: 2022-09-13
  Administered 2022-09-13: 02:00:00 via INTRAVENOUS

## 2022-09-13 MED ORDER — OSELTAMIVIR PHOSPHATE 75 MG PO CAPS
75 MG | ORAL_CAPSULE | Freq: Two times a day (BID) | ORAL | 0 refills | Status: AC
Start: 2022-09-13 — End: 2022-09-17

## 2022-09-13 MED ORDER — ONDANSETRON HCL 4 MG/2ML IJ SOLN
4 MG/2ML | Freq: Once | INTRAMUSCULAR | Status: AC
Start: 2022-09-13 — End: 2022-09-12
  Administered 2022-09-13: 02:00:00 4 mg via INTRAVENOUS

## 2022-09-13 MED FILL — LACTATED RINGERS IV SOLN: INTRAVENOUS | Qty: 1000

## 2022-09-13 MED FILL — ACETAMINOPHEN 10 MG/ML IV SOLN: 10 MG/ML | INTRAVENOUS | Qty: 100

## 2022-09-13 MED FILL — ONDANSETRON HCL 4 MG/2ML IJ SOLN: 4 MG/2ML | INTRAMUSCULAR | Qty: 2

## 2022-09-13 MED FILL — OSELTAMIVIR PHOSPHATE 75 MG PO CAPS: 75 MG | ORAL | Qty: 1

## 2022-09-14 NOTE — L&D Delivery Note (Signed)
Knapp, Tatums Girl Judi [id number]      Labor Events    Preterm Labor: No  Antenatal Steroids: None  Cervical Ripening Date/Time:  12/03/22 14:21:00   Cervical Ripening Type: Misoprostol  Antibiotics Received during Labor: No  Rupture Date/Time:  12/04/22 18:15:00   Rupture Type: AROM, Intact  Fluid Color: Clear, Bloody Show  Fluid Odor: None  Fluid Volume: Scant  Induction: AROM, Cervidil, Misoprostol, Oxytocin  Labor Complications: Pre-eclampsia              Anesthesia    Method: Epidural       Labor Event Times      Labor onset date/time:        Dilation complete date/time:  12/05/22 08:00:00     Start pushing date/time:  12/05/2022 08:07:00   Decision date/time (emergent c-section):            Labor Length    2nd stage: 0h 52m  3rd stage: 0h 85m       Delivery Details      Delivery Date: 12/05/22 Delivery Time: 08:17:00   Delivery Type: Vaginal, Spontaneous              Newborn Presentation    Presentation: Vertex  Position: Right  _: Occiput  _: Anterior       Shoulder Dystocia    Shoulder Dystocia Present?: No       Assisted Delivery Details    Forceps Attempted?: No  Vacuum Extractor Attempted?: No                           Cord    Vessels: 3 Vessels  Complications: None  Cord Clamped Date/Time: 12/05/2022 08:18:00  Cord Blood Disposition: Lab  Gases Sent?: No              Placenta    Date/Time: 12/05/2022 08:19:00  Removal: Spontaneous  Appearance: Intact  Disposition: Lab       Lacerations    Episiotomy: None  Perineal Lacerations: 1st  Other Lacerations: no non-perineal laceration  Number of Repair Packets: 1       Vaginal Counts    Initial Count Personnel: M BUCHHOLZ,RN`  Initial Count Verified By: DR. GRAY  Intial Sponge Count: Correct Intial Needles Count: Correct Intial Instruments Count: Correct   Final Sponges Count: Correct Final Needles  Count: Correct Final Instruments Count: Correct   Final Count Personnel: Kandis Ban  Final Count Verified By: DR. GRAY/[id number]  Accurate Final Count?: Yes        Blood Loss  Mother: Olesya, Melle [id number]     Start of Mother's Information      Delivery Blood Loss  12/04/22 2017 - 12/05/22 1019      Quantitative Blood Loss - Delivery (mL) Hospital Encounter 50    Total  50 mL               End of Mother's Information  Mother: Caneshia, Goughnour [id number]                Delivery Providers    Delivering clinician: Ardath Sax, MD     Provider Role    Ardath Sax, MD Obstetrician    Bertell Maria, RN Primary Nurse    Cheral Bay, RN Primary Newborn Nurse     NICU Nurse     Neonatologist     Anesthesiologist     Nurse Anesthetist  Nurse Practitioner     Midwife     Nursery Nurse     Scrub Tech              Newborn Assessment    Living Status: Living        Skin Color:   Heart Rate:   Reflex Irritability:   Muscle Tone:   Respiratory Effort:   Total:            1 Minute:    0    2    2    2    2    8         5  Minute:    1    2    2    2    2    9                                         Apgars Assigned By: Lenna Sciara. [facility]              Resuscitation    Method: Bulb Suction, Stimulation             Newborn Measurements      Birth Weight: 2780 g   Birth Length: 49 cm              Skin to Skin      Skin to Skin Initiation Date/Time: 12/05/22 08:18:00 EDT     Skin to Skin With: Mother                  37 y.o. [id number] at [redacted]w[redacted]d was admitted for unscheduled induction for preeclampsia. After two days of cervical ripening she quickly progressed in the active phase and delivered a viable female infant with APGARs 8/9 under epidural anesthesia. Delivery was via NSVD. Infant head delivered atraumatically in ROA position by shoulders and body without complication. Infant was bulb suctioned at delivery. No meconium. Cord was clamped x2, cut, and infant handed to mom and nursing. Placenta and 3 vessel cord delivered intact spontaneously with gentle traction. 40 U pitocin given. Cervix, vagina, rectum, and perineum explored and a 1st degree laceration was repaired in normal  sterile fashion with 2-0 vicryl suture. Hemostasis assured via massage. EBL 50cc. Patient and newborn in stable condition.    Lurlie Wigen Simon Rhein, MD

## 2022-10-29 LAB — C. TRACHOMATIS, EXTERNAL RESULT: C. Trachomatis, External Result: POSITIVE

## 2022-11-19 LAB — GBS, EXTERNAL RESULT: GBS, External Result: NEGATIVE

## 2022-11-19 LAB — C. TRACHOMATIS, EXTERNAL RESULT: C. Trachomatis, External Result: NEGATIVE

## 2022-12-01 ENCOUNTER — Inpatient Hospital Stay: Admit: 2022-12-01 | Discharge: 2022-12-01 | Disposition: A | Discharge status: Home / Self Care | Payer: MEDICAID

## 2022-12-01 DIAGNOSIS — O26893 Other specified pregnancy related conditions, third trimester: Secondary | ICD-10-CM

## 2022-12-01 LAB — CBC
Hematocrit: 33.4 % — ABNORMAL LOW (ref 35.0–47.0)
Hemoglobin: 11 gm/dl (ref 11.0–16.0)
MCH: 28.1 pg (ref 25.4–34.6)
MCHC: 32.9 gm/dl (ref 30.0–36.0)
MCV: 85.4 fL (ref 80.0–98.0)
MPV: 11.4 fL — ABNORMAL HIGH (ref 6.0–10.0)
Platelets: 258 10*3/uL (ref 140–450)
RBC: 3.91 M/uL (ref 3.60–5.20)
RDW: 43.1 (ref 36.4–46.3)
WBC: 16.3 10*3/uL — ABNORMAL HIGH (ref 4.0–11.0)

## 2022-12-01 LAB — ABO/RH: ABO/Rh: B POS

## 2022-12-01 LAB — COMPREHENSIVE METABOLIC PANEL
ALT: 8 U/L — ABNORMAL LOW (ref 10–49)
AST: 18 U/L (ref 0.0–33.9)
Albumin: 2.6 gm/dl — ABNORMAL LOW (ref 3.4–5.0)
Alkaline Phosphatase: 174 U/L — ABNORMAL HIGH (ref 46–116)
Anion Gap: 11 mmol/L (ref 5–15)
BUN: 8 mg/dl — ABNORMAL LOW (ref 9–23)
CO2: 19 mEq/L — ABNORMAL LOW (ref 20–31)
Calcium: 8.9 mg/dl (ref 8.7–10.4)
Chloride: 107 mEq/L (ref 98–107)
Creatinine: 0.57 mg/dl (ref 0.55–1.02)
GFR African American: >60
GFR Non-African American: >60
Glucose: 106 mg/dl (ref 74–106)
Potassium: 3.2 mEq/L — ABNORMAL LOW (ref 3.5–5.1)
Sodium: 137 mEq/L (ref 136–145)
Total Bilirubin: 0.4 mg/dl (ref 0.30–1.20)
Total Protein: 6.5 gm/dl (ref 5.7–8.2)

## 2022-12-01 LAB — PROTEIN / CREATININE RATIO, URINE
Creatinine, Random Urine: 120.2 mg/dl (ref 30.0–125.0)
Protein, Urine, Random: 51.6 mg/dl — ABNORMAL HIGH (ref 1.0–14.0)
Urine Total Protein Creatinine Ratio: 0.43 gm/24 Hr — ABNORMAL HIGH (ref 0.00–0.19)

## 2022-12-01 LAB — LACTATE DEHYDROGENASE: LD: 179 U/L (ref 120–246)

## 2022-12-01 LAB — ANTIBODY SCREEN: Antibody Screen: NEGATIVE

## 2022-12-01 MED ORDER — LACTATED RINGERS IV BOLUS
Freq: Once | INTRAVENOUS | Status: AC
Start: 2022-12-01 — End: 2022-12-01
  Administered 2022-12-01: 11:00:00 500 mL via INTRAVENOUS

## 2022-12-01 MED FILL — LACTATED RINGERS IV SOLN: INTRAVENOUS | Qty: 500

## 2022-12-01 NOTE — Progress Notes (Addendum)
0715-pt resting quietly in bed. Pt has no pre-e complaints. Lights dim and call bell in reach.     0815-dr.rosado at bedside. Cervix is unchanged. Pt is to keep Thursday appt. Md discussed with pt that labs are mostly wnl. Pt aware if bp is elevated thurs or a higher pcr noted thurday pt will be admitted for induction. Pt verbalize understanding. Early labor discussed with pt. Pt knows precautions.     0832-pt ambulates off the unit undelivered with copy of d/c instruc in hand.

## 2022-12-01 NOTE — Progress Notes (Addendum)
[id number]: Pt presents to OBED unit reporting abdominal pain since 0300. Pt unsure if pain is related to contractions. Pt reporting lower abdominal pressure that radiates into legs and back. Membranes intact; no presence of vaginal discharge or bloody show. Fundus palpates soft. Fetal movement endorsed but pt states she has only felt infant "elbow" her once since 0300.    Pt denies headache, blurry vision, RUQ pain, or shortness of breath. Pt reports floaters in vision two weeks prior.     [id number]: MD Rosado-Torres at  bedside to assess patient. SVE of fingertip/75/-3. Labs to be drawn.    0715: SBAR report given to dayshift RN. Pt care released.

## 2022-12-01 NOTE — ED Provider Notes (Signed)
[facility]  Obstetrics Emergency Department Treatment Note        Patient: Michelle Mcintyre Age: 37 y.o. Sex: female    Date of Birth: 1986-01-06 Admit Date: 12/01/2022 PCP: Unknown, Provider, DO   MRN: [id number]  CSN: [id number]     Room: 3115/3115 Time Dictated: 8:21 AM        Chief Complaint   Chief Complaint   Patient presents with    Abdominal Pain        History of Present Illness   Michelle Mcintyre is a 37 y.o. [phone] with an estimated gestational age of [redacted]w[redacted]d with Estimated Date of Delivery: 12/12/22. She presents to OBED complaining of abdominal pain and pelvic pressure that started around 3am coming and going every 5-7 minutes. Patient denies any vaginal bleeding, leakage of fluids, headache or visual changes. Patient states the only complication during this pregnancy was subchorionic hematoma in the first trimester     Prenatal care by: Mathison    PNC:  Chlamydia infection treated at 34wk  AMA      PNL:  B pos  Antibody screen neg  Rubella immune  RPR NR  Hep B neg  HIV neg  Hep C neg  GBS neg    OB History   Gravida Para Term Preterm AB Living   5 3 3   1 2    SAB IAB Ectopic Molar Multiple Live Births       1     2      # Outcome Date GA Lbr Len/2nd Weight Sex Delivery Anes PTL Lv   5 Current            4 Term 06/11/11 [redacted]w[redacted]d  3.289 kg (7 lb 4 oz) F Vag-Spont EPI  LIV   3 Term 02/07/10 [redacted]w[redacted]d  2.863 kg (6 lb 5 oz) M Vag-Spont  N LIV   2 Term 10/05/08 [redacted]w[redacted]d  2.353 kg (5 lb 3 oz) M Vag-Spont EPI N       Complications: Oligohydramnios   1 Ectopic 2009                Review of Systems   Review of Systems   Constitutional: Negative.  Negative for fever.   Eyes: Negative.  Negative for visual disturbance.   Genitourinary:  Positive for pelvic pain. Negative for vaginal bleeding and vaginal discharge.   Neurological: Negative.  Negative for headaches.        Past Medical/Surgical History     Past Medical History:   Diagnosis Date    AMA (advanced maternal age) multigravida 35+     Heart murmur      Subchorionic hematoma in first trimester      Past Surgical History:   Procedure Laterality Date    ECTOPIC PREGNANCY SURGERY  2009       Social History     Social History     Socioeconomic History    Marital status: Married     Spouse name: Not on file    Number of children: Not on file    Years of education: Not on file    Highest education level: Not on file   Occupational History    Not on file   Tobacco Use    Smoking status: Former     Types: Cigarettes    Smokeless tobacco: Former   Substance and Sexual Activity    Alcohol use: Not Currently    Drug use: Not on file  Sexual activity: Yes   Other Topics Concern    Not on file   Social History Narrative    Not on file     Social Determinants of Health     Financial Resource Strain: Not on file   Food Insecurity: Not on file   Transportation Needs: Not on file   Physical Activity: Not on file   Stress: Not on file   Social Connections: Not on file   Intimate Partner Violence: Not on file   Housing Stability: Not on file       Family History   History reviewed. No pertinent family history.    Current Medications     Current Facility-Administered Medications   Medication Dose Route Frequency Provider Last Rate Last Admin    lactated ringers bolus 500 mL  500 mL IntraVENous Once Rosado-Torres, Aundria Rud, MD 247.9 mL/hr at 12/01/22 0637 500 mL at 12/01/22 [id number]     Current Outpatient Medications   Medication Sig Dispense Refill    Prenatal MV-Min-Fe Fum-FA-DHA (PRENATAL 1 PO) Take by mouth          Allergies   No Known Allergies    Physical Exam   Patient Vitals for the past 24 hrs:   BP Temp Temp src Pulse Resp SpO2 Height Weight   12/01/22 0801 111/60 -- -- 77 -- 98 % -- --   12/01/22 0746 114/63 -- -- 80 -- 99 % -- --   12/01/22 0717 120/77 -- -- 81 -- -- -- --   12/01/22 0646 134/70 -- -- 87 -- 98 % -- --   12/01/22 0641 117/70 -- -- 91 -- 99 % -- --   12/01/22 0636 -- -- -- -- -- 99 % -- --   12/01/22 0631 -- -- -- -- -- 99 % -- --   12/01/22 0610 (!) 161/91 --  -- (!) 106 -- -- -- --   12/01/22 0558 (!) 145/92 -- -- (!) 104 -- -- -- --   12/01/22 0546 -- -- -- -- -- 98 % -- --   12/01/22 0541 -- -- -- -- -- 98 % -- --   12/01/22 0540 -- -- -- -- -- -- 1.676 m (5\' 6" ) 101.6 kg (224 lb)   12/01/22 0538 (!) 143/87 98.4 F (36.9 C) Oral 94 16 98 % -- --      ED Triage Vitals   Enc Vitals Group      BP 12/01/22 0538 (!) 143/87      Pulse 12/01/22 0538 94      Respirations 12/01/22 0538 16      Temp 12/01/22 0538 98.4 F (36.9 C)      Temp Source 12/01/22 0538 Oral      SpO2 12/01/22 0538 98 %      Weight - Scale 12/01/22 0540 101.6 kg (224 lb)      Height 12/01/22 0540 1.676 m (5\' 6" )      Head Circumference --       Peak Flow --       Pain Score --       Pain Loc --       Pain Edu? --       Excl. in GC? --         Physical Exam  Cardiovascular:      Rate and Rhythm: Normal rate.      Pulses: Normal pulses.   Pulmonary:      Effort: Pulmonary effort is normal.  Abdominal:      Tenderness: There is no abdominal tenderness.   Genitourinary:     General: Normal vulva.   Neurological:      General: No focal deficit present.      Mental Status: She is alert and oriented to person, place, and time.           OB Exam     Cervical Exam: FT/75/-3  Uterine Activity: q 8 min  Fetal Heart Rate: 130, category 1 and reactive/reassuring, moderate variability, accelerations present, no decelerations  I personally reviewed and interpreted the fetal non-stress test.    Cervical Exam  Dilation (cm): Fingertip  Effacement: 70  Station: -3  Station Conservation officer, nature): 8  OB Examiner: dr.rosado    Diagnostic Studies   Lab:   Recent Results (from the past 12 hour(s))   CBC    Collection Time: 12/01/22  6:30 AM   Result Value Ref Range    WBC 16.3 (H) 4.0 - 11.0 1000/mm3    RBC 3.91 3.60 - 5.20 M/uL    Hemoglobin 11.0 11.0 - 16.0 gm/dl    Hematocrit 33.4 (L) 35.0 - 47.0 %    MCV 85.4 80.0 - 98.0 fL    MCH 28.1 25.4 - 34.6 pg    MCHC 32.9 30.0 - 36.0 gm/dl    Platelets 258 140 - 450 1000/mm3    MPV  11.4 (H) 6.0 - 10.0 fL    RDW 43.1 36.4 - 46.3     Comprehensive Metabolic Panel    Collection Time: 12/01/22  6:30 AM   Result Value Ref Range    Potassium 3.2 (L) 3.5 - 5.1 mEq/L    Chloride 107 98 - 107 mEq/L    Sodium 137 136 - 145 mEq/L    CO2 19 (L) 20 - 31 mEq/L    Glucose 106 74 - 106 mg/dl    BUN 8 (L) 9 - 23 mg/dl    Creatinine 0.57 0.55 - 1.02 mg/dl    GFR African American >60.0      GFR Non-African American >60      Calcium 8.9 8.7 - 10.4 mg/dl    Anion Gap 11 5 - 15 mmol/L    AST 18.0 0.0 - 33.9 U/L    ALT 8 (L) 10 - 49 U/L    Alkaline Phosphatase 174 (H) 46 - 116 U/L    Total Bilirubin 0.40 0.30 - 1.20 mg/dl    Total Protein 6.5 5.7 - 8.2 gm/dl    Albumin 2.6 (L) 3.4 - 5.0 gm/dl   Lactate Dehydrogenase    Collection Time: 12/01/22  6:30 AM   Result Value Ref Range    LD 179 120 - 246 U/L   Protein / creatinine ratio, urine    Collection Time: 12/01/22  6:30 AM   Result Value Ref Range    Creatinine, Random Urine 120.2 30.0 - 125.0 mg/dl    Protein, Urine, Random 51.6 (H) 1.0 - 14.0 mg/dl    Urine Total Protein Creatinine Ratio 0.43 (H) 0.00 - 0.19 gm/24 Hr   ABO/RH    Collection Time: 12/01/22  6:30 AM   Result Value Ref Range    ABO/Rh B Rh Positive     ANTIBODY SCREEN    Collection Time: 12/01/22  6:30 AM   Result Value Ref Range    Antibody Screen NEG       Labs Reviewed   CBC - Abnormal; Notable for the following components:  Result Value    WBC 16.3 (*)     Hematocrit 33.4 (*)     MPV 11.4 (*)     All other components within normal limits   COMPREHENSIVE METABOLIC PANEL - Abnormal; Notable for the following components:    Potassium 3.2 (*)     CO2 19 (*)     BUN 8 (*)     ALT 8 (*)     Alkaline Phosphatase 174 (*)     Albumin 2.6 (*)     All other components within normal limits   PROTEIN / CREATININE RATIO, URINE - Abnormal; Notable for the following components:    Protein, Urine, Random 51.6 (*)     Urine Total Protein Creatinine Ratio 0.43 (*)     All other components within normal limits    LACTATE DEHYDROGENASE   ABO/RH    Narrative:     Specimen is valid for 3 days - nurse to verify valid specimen   ANTIBODY SCREEN    Narrative:     Specimen is valid for 3 days - nurse to verify valid specimen   TYPE AND SCREEN         Imaging:    No results found.     ED Course     BP 111/60   Pulse 77   Temp 98.4 F (36.9 C) (Oral)   Resp 16   Ht 1.676 m (5\' 6" )   Wt 101.6 kg (224 lb)   SpO2 98%   BMI 36.15 kg/m            Medications   lactated ringers bolus 500 mL (500 mLs IntraVENous New Bag 12/01/22 [id number])       Medical Decision Making   Abdominal pain in pregnancy- NST  Severe range blood pressure reading- serial BP, PIH panel,     Final Diagnosis / MDM   [id number]  AMA  Abdominal pain in pregnancy  Elevated blood pressure reading      Disposition   Discharge home  [place] precaution  Follow up Thursday        Ruffin Frederick, MD  [facility]  December 01, 2022  8:21 AM      My signature above authenticates this document and my orders, the final    diagnosis(es), discharge prescription(s), and instructions in the Epic    record.  If you have any questions please contact [phone].  Nursing notes have been reviewed by myself.

## 2022-12-01 NOTE — Discharge Instructions (Signed)
Home Undelivered Discharge Instructions    After Discharge Orders:    Follow up appt in OBGYN office Thursday 12/03/22.              Diet:  normal diet as tolerated    Rest: normal activity as tolerated    Other instructions: Do kick counts once a day on your baby. Choose the time of day your baby is most active. Get in a comfortable lying or sitting position and time how long it takes to feel 10 kicks, twists, turns, swishes, or rolls. Call your physician or midwife if there have not been 10 kicks in 1 hours    Call physician or midwife, return to Labor and Delivery, call 911, or go to the nearest Emergency Room if: increased leakage or fluid, decreased fetal movement, persistent low back pain or cramping, bleeding from vaginal area, difficulty urinating, pain with urination, difficulty breathing, new calf pain, persistent headache, or vision change

## 2022-12-02 DIAGNOSIS — O1493 Unspecified pre-eclampsia, third trimester: Secondary | ICD-10-CM

## 2022-12-02 DIAGNOSIS — O1404 Mild to moderate pre-eclampsia, complicating childbirth: Principal | ICD-10-CM

## 2022-12-02 NOTE — Progress Notes (Addendum)
Pt arrives to OBED c/o elevated BP, visual disturbances, back of head H/A and intermittent RUQ pain. Pt states these symptoms began around 0700 this morning.     Pt states she took 1000mg  Tylenol, x2 doses today at 0700 and 1500. Pt confirms +FM.     2038- Dr. Lenn Sink in room performing assessment at this time. POC to collect urine for PCR and draw [place] labs.

## 2022-12-02 NOTE — Progress Notes (Signed)
2255- Report received from [facility].    2300- G5P3 [redacted]w[redacted]d patient verbalizes good fetal movement and denies any SROM, vaginal bleeding or CTX type or pain/cramping. At this time, patient does have a headache, some black floaters that are constant and a right sided epigastric pain that is intermittent.

## 2022-12-02 NOTE — Procedures (Signed)
[place]    NST Procedure Note    Patient: Michelle Mcintyre Age: 37 y.o. Sex: female    Date of Birth: [birth year] Admit Date: 12/02/2022 PCP: Unknown, Provider, DO   MRN: [id number]  CSN: [id number]  LOS: 0      Estimated Gestational Age: [redacted]w[redacted]d     Indication for NST: pregnancy-induced hypertension    15 x 15    Fetal Vital Signs:  Mode: External  Fetal Heart Rate: 140  Fetal Activity: Present  Variability: Moderate 6-25 bmp  Accelerations: Yes  Decelerations: No  FHR Interpretations: Category I    Non-Stress Test: reactive    Uterine Activity: None    Signed [id number] Benard Halsted, MD  12/02/2022 10:23 PM

## 2022-12-02 NOTE — Plan of Care (Signed)
Problem: Vaginal Birth or Cesarean Section  Goal: Fetal and maternal status remain reassuring during the birth process  Description:  Birth OB-Pregnancy care plan goal which identifies if the fetal and maternal status remain reassuring during the birth process  Outcome: Progressing     Problem: Pain  Goal: Verbalizes/displays adequate comfort level or baseline comfort level  Outcome: Progressing     Problem: Infection - Adult  Goal: Absence of infection at discharge  Outcome: Progressing  Goal: Absence of infection during hospitalization  Outcome: Progressing  Goal: Absence of fever/infection during anticipated neutropenic period  Outcome: Progressing     Problem: Safety - Adult  Goal: Free from fall injury  Outcome: Progressing

## 2022-12-02 NOTE — H&P (Addendum)
[place]  Obstetrics History and Physical        Patient: Michelle Mcintyre Age: 37 y.o. Sex: female    Date of Birth: 12-10-1985 Admit Date: 12/02/2022 PCP: Unknown, Provider, DO   MRN: [id number]  CSN: [id number]     Room: 3113/3113 Time Dictated: 10:04 PM        Chief Complaint   Chief Complaint   Patient presents with    Hypertension        History of Present Illness   37 y.o. G5P3013 at 38.4 wks. She presents with a complaint of headaches that begina around 7 am that was accompanied with seeing stars and visual changes. She report + fetal movement and denies any loss of fluid, contractions, abnl discharge or vaginal bleeding.       Primary OB:  Dr. Pearline Cables Sr.      PNC:  Elevated BP the past couple of days    PNL:  Blood Type: B+  Ab screen: Positive  GBS: Negative  Rubella Immune_Non-Immune: Immune  HBsAg negative  GC/CT negative TOC for Chlamydia Negative on 11/19/22  HIV negative  RPR negative  1 hour GTT 114    OB History   Gravida Para Term Preterm AB Living   5 3 3   1 2    SAB IAB Ectopic Molar Multiple Live Births       1     2      # Outcome Date GA Lbr Len/2nd Weight Sex Delivery Anes PTL Lv   5 Current            4 Term 06/11/11 [redacted]w[redacted]d  3.289 kg (7 lb 4 oz) F Vag-Spont EPI  LIV   3 Term 02/07/10 [redacted]w[redacted]d  2.863 kg (6 lb 5 oz) M Vag-Spont  N LIV   2 Term 10/05/08 [redacted]w[redacted]d  2.353 kg (5 lb 3 oz) M Vag-Spont EPI N       Complications: Oligohydramnios   1 Ectopic 2009                Review of Systems   Review of Systems   Constitutional: Negative.    HENT: Negative.     Eyes:  Positive for visual disturbance.   Respiratory: Negative.     Cardiovascular: Negative.    Gastrointestinal: Negative.    Endocrine: Negative.    Genitourinary: Negative.    Musculoskeletal: Negative.    Neurological:  Positive for headaches.   Hematological: Negative.    Psychiatric/Behavioral: Negative.          Past Medical/Surgical History     Past Medical History:   Diagnosis Date    AMA (advanced maternal age) multigravida 35+      Heart murmur     Subchorionic hematoma in first trimester      Past Surgical History:   Procedure Laterality Date    ECTOPIC PREGNANCY SURGERY  2009       Social History     Social History     Socioeconomic History    Marital status: Married     Spouse name: Not on file    Number of children: Not on file    Years of education: Not on file    Highest education level: Not on file   Occupational History    Not on file   Tobacco Use    Smoking status: Former     Types: Cigarettes    Smokeless tobacco: Former   Substance and Sexual Activity  Alcohol use: Not Currently    Drug use: Not on file    Sexual activity: Yes   Other Topics Concern    Not on file   Social History Narrative    Not on file     Social Determinants of Health     Financial Resource Strain: Not on file   Food Insecurity: Not on file   Transportation Needs: Not on file   Physical Activity: Not on file   Stress: Not on file   Social Connections: Not on file   Intimate Partner Violence: Not on file   Housing Stability: Not on file       Family History   No family history on file.    Current Medications     Prior to Admission Medications   Prescriptions Last Dose Informant Patient Reported? Taking?   Prenatal MV-Min-Fe Fum-FA-DHA (PRENATAL 1 PO) 12/02/2022  Yes No   Sig: Take by mouth      Facility-Administered Medications: None       Allergies   No Known Allergies    Physical Exam   @VS12 @  @EDTRIAGEVITALSNEW @  Physical Exam  Constitutional:       Appearance: Normal appearance.   HENT:      Head: Normocephalic and atraumatic.   Cardiovascular:      Rate and Rhythm: Normal rate and regular rhythm.   Pulmonary:      Effort: Pulmonary effort is normal.   Abdominal:      Palpations: Abdomen is soft.   Genitourinary:     General: Normal vulva.      Comments: Cervical Exam   FT/30%/-3  Musculoskeletal:      Cervical back: Normal range of motion and neck supple.   Neurological:      General: No focal deficit present.      Mental Status: She is alert and  oriented to person, place, and time.   Psychiatric:         Mood and Affect: Mood normal.         Behavior: Behavior normal.           OB Exam     Fetal Assessment: 140 Moderate Variability Reactive Category 1 tracing  [place]:  None see          Impression and Management Plan   37 y/o [phone] with elevated BP, PIH, headaches and Visual changes  - PIH labs and reassess        Vernetta Honey, MD, [facility]  [facility]  December 02, 2022  10:04 PM    Diagnostic Studies   Lab:   Recent Results (from the past 12 hour(s))   Uric Acid    Collection Time: 12/02/22  8:53 PM   Result Value Ref Range    Uric Acid, Serum 4.4 3.1 - 7.8 mg/dl   Lactate Dehydrogenase    Collection Time: 12/02/22  8:53 PM   Result Value Ref Range    LD 239 120 - 246 U/L   Comprehensive Metabolic Panel    Collection Time: 12/02/22  8:53 PM   Result Value Ref Range    Potassium 3.7 3.5 - 5.1 mEq/L    Chloride 106 98 - 107 mEq/L    Sodium 138 136 - 145 mEq/L    CO2 21 20 - 31 mEq/L    Glucose 77 74 - 106 mg/dl    BUN 6 (L) 9 - 23 mg/dl    Creatinine 0.57 0.55 - 1.02 mg/dl  GFR African American >60.0      GFR Non-African American >60      Calcium 9.4 8.7 - 10.4 mg/dl    Anion Gap 11 5 - 15 mmol/L    AST 22.0 0.0 - 33.9 U/L    ALT 10 10 - 49 U/L    Alkaline Phosphatase 198 (H) 46 - 116 U/L    Total Bilirubin 0.40 0.30 - 1.20 mg/dl    Total Protein 7.3 5.7 - 8.2 gm/dl    Albumin 3.0 (L) 3.4 - 5.0 gm/dl   Protein / creatinine ratio, urine    Collection Time: 12/02/22  8:53 PM   Result Value Ref Range    Creatinine, Random Urine 105.3 30.0 - 125.0 mg/dl    Protein, Urine, Random 31.5 (H) 1.0 - 14.0 mg/dl    Urine Total Protein Creatinine Ratio 0.30 (H) 0.00 - 0.19 gm/24 Hr   CBC    Collection Time: 12/02/22  8:53 PM   Result Value Ref Range    WBC 18.1 (H) 4.0 - 11.0 1000/mm3    RBC 4.18 3.60 - 5.20 M/uL    Hemoglobin 11.9 11.0 - 16.0 gm/dl    Hematocrit 35.5 35.0 - 47.0 %    MCV 84.9 80.0 - 98.0 fL    MCH 28.5 25.4 - 34.6 pg    MCHC 33.5 30.0 - 36.0  gm/dl    Platelets 288 140 - 450 1000/mm3    MPV 11.2 (H) 6.0 - 10.0 fL    RDW 43.1 36.4 - 46.3       Labs Reviewed   COMPREHENSIVE METABOLIC PANEL - Abnormal; Notable for the following components:       Result Value    BUN 6 (*)     Alkaline Phosphatase 198 (*)     Albumin 3.0 (*)     All other components within normal limits   PROTEIN / CREATININE RATIO, URINE - Abnormal; Notable for the following components:    Protein, Urine, Random 31.5 (*)     Urine Total Protein Creatinine Ratio 0.30 (*)     All other components within normal limits   CBC - Abnormal; Notable for the following components:    WBC 18.1 (*)     MPV 11.2 (*)     All other components within normal limits   URIC ACID   LACTATE DEHYDROGENASE       Imaging:    @[id number] @    ED Course   Vitals remains with BP slightly elevated  @VS12 @         Medications - No data to display    Medical Decision Making   Case d/w Dr Carmelia Bake. Secondary to second day on OBED with elevated BP and symptoms will admit      Disposition   Admit to L&D for induction of labor and will Follow BP if increase to severe range will consider magnesium Sulfate                My signature above authenticates this document and my orders, the final    diagnosis(es), discharge prescription(s), and instructions in the Epic    record.  If you have any questions please contact [phone].  Nursing notes have been reviewed by myself.

## 2022-12-03 ENCOUNTER — Inpatient Hospital Stay
Admission: EM | Admit: 2022-12-03 | Discharge: 2022-12-07 | Disposition: A | Discharge status: Home / Self Care | Payer: MEDICAID | Admitting: Obstetrics & Gynecology

## 2022-12-03 LAB — COMPREHENSIVE METABOLIC PANEL
ALT: 10 U/L (ref 10–49)
AST: 22 U/L (ref 0.0–33.9)
Albumin: 3 gm/dl — ABNORMAL LOW (ref 3.4–5.0)
Alkaline Phosphatase: 198 U/L — ABNORMAL HIGH (ref 46–116)
Anion Gap: 11 mmol/L (ref 5–15)
BUN: 6 mg/dl — ABNORMAL LOW (ref 9–23)
CO2: 21 mEq/L (ref 20–31)
Calcium: 9.4 mg/dl (ref 8.7–10.4)
Chloride: 106 mEq/L (ref 98–107)
Creatinine: 0.57 mg/dl (ref 0.55–1.02)
GFR African American: >60
GFR Non-African American: >60
Glucose: 77 mg/dl (ref 74–106)
Potassium: 3.7 mEq/L (ref 3.5–5.1)
Sodium: 138 mEq/L (ref 136–145)
Total Bilirubin: 0.4 mg/dl (ref 0.30–1.20)
Total Protein: 7.3 gm/dl (ref 5.7–8.2)

## 2022-12-03 LAB — CBC
Hematocrit: 35.5 % (ref 35.0–47.0)
Hemoglobin: 11.9 gm/dl (ref 11.0–16.0)
MCH: 28.5 pg (ref 25.4–34.6)
MCHC: 33.5 gm/dl (ref 30.0–36.0)
MCV: 84.9 fL (ref 80.0–98.0)
MPV: 11.2 fL — ABNORMAL HIGH (ref 6.0–10.0)
Platelets: 288 10*3/uL (ref 140–450)
RBC: 4.18 M/uL (ref 3.60–5.20)
RDW: 43.1 (ref 36.4–46.3)
WBC: 18.1 10*3/uL — ABNORMAL HIGH (ref 4.0–11.0)

## 2022-12-03 LAB — PROTEIN / CREATININE RATIO, URINE
Creatinine, Random Urine: 105.3 mg/dl (ref 30.0–125.0)
Protein, Urine, Random: 31.5 mg/dl — ABNORMAL HIGH (ref 1.0–14.0)
Urine Total Protein Creatinine Ratio: 0.3 gm/24 Hr — ABNORMAL HIGH (ref 0.00–0.19)

## 2022-12-03 LAB — URIC ACID: Uric Acid, Serum: 4.4 mg/dl (ref 3.1–7.8)

## 2022-12-03 LAB — LACTATE DEHYDROGENASE: LD: 239 U/L (ref 120–246)

## 2022-12-03 MED ORDER — FENTANYL-BUPIVACAINE-NACL 0.2-0.1-0.9 MG/100ML-% EP SOLN
EPIDURAL | Status: AC
Start: 2022-12-03 — End: 2022-12-04
  Administered 2022-12-04: 19:00:00 12 via EPIDURAL

## 2022-12-03 MED ORDER — FENTANYL CITRATE (PF) 100 MCG/2ML IJ SOLN
1002 MCG/2ML | INTRAMUSCULAR | Status: AC | PRN
Start: 2022-12-03 — End: 2022-12-05
  Administered 2022-12-03 (×2): 25 ug via INTRAVENOUS

## 2022-12-03 MED ORDER — MISOPROSTOL 200 MCG PO TABS
200 MCG | ORAL | Status: AC | PRN
Start: 2022-12-03 — End: 2022-12-05

## 2022-12-03 MED ORDER — LACTATED RINGERS IV SOLN
Freq: Once | INTRAVENOUS | Status: AC
Start: 2022-12-03 — End: 2022-12-05

## 2022-12-03 MED ORDER — OXYTOCIN 30 UNITS IN 500 ML INFUSION
30 | INTRAVENOUS | Status: AC | PRN
Start: 2022-12-03 — End: 2022-12-04

## 2022-12-03 MED ORDER — LACTATED RINGERS IV BOLUS
INTRAVENOUS | Status: AC | PRN
Start: 2022-12-03 — End: 2022-12-05

## 2022-12-03 MED ORDER — SODIUM CHLORIDE 0.9 % IV SOLN
0.9 % | INTRAVENOUS | Status: AC | PRN
Start: 2022-12-03 — End: 2022-12-05

## 2022-12-03 MED ORDER — ACETAMINOPHEN 325 MG PO TABS
325 | ORAL | Status: DC | PRN
Start: 2022-12-03 — End: 2022-12-05
  Administered 2022-12-03: 06:00:00 650 mg via ORAL

## 2022-12-03 MED ORDER — TRANEXAMIC ACID 1000 MG/10ML IV SOLN
1000 | Freq: Once | INTRAVENOUS | Status: AC | PRN
Start: 2022-12-03 — End: 2022-12-03

## 2022-12-03 MED ORDER — TERBUTALINE SULFATE 1 MG/ML IJ SOLN
1 | Freq: Once | INTRAMUSCULAR | Status: AC | PRN
Start: 2022-12-03 — End: 2022-12-03

## 2022-12-03 MED ORDER — METHYLERGONOVINE MALEATE 0.2 MG/ML IJ SOLN
0.2 MG/ML | INTRAMUSCULAR | Status: AC | PRN
Start: 2022-12-03 — End: 2022-12-05

## 2022-12-03 MED ORDER — MISOPROSTOL 25 MCG PRE-SPLIT TABLET
25 | Freq: Once | ORAL | Status: AC
Start: 2022-12-03 — End: 2022-12-03
  Administered 2022-12-03: 18:00:00 25 ug via VAGINAL

## 2022-12-03 MED ORDER — BUTORPHANOL TARTRATE 2 MG/ML IJ SOLN
2 | INTRAMUSCULAR | Status: AC | PRN
Start: 2022-12-03 — End: 2022-12-03
  Administered 2022-12-03: 09:00:00 0.5 mg via INTRAVENOUS
  Administered 2022-12-03: 13:00:00 1 mg via INTRAVENOUS

## 2022-12-03 MED ORDER — LIDOCAINE-EPINEPHRINE 1.5 %-1:200000 IJ SOLN
1.5-1200000 %-1:200000 | INTRAMUSCULAR | Status: AC | PRN
Start: 2022-12-03 — End: 2022-12-05
  Administered 2022-12-03: 21:00:00 3 via EPIDURAL

## 2022-12-03 MED ORDER — ONDANSETRON 4 MG PO TBDP
4 MG | Freq: Four times a day (QID) | ORAL | Status: AC | PRN
Start: 2022-12-03 — End: 2022-12-05

## 2022-12-03 MED ORDER — FENTANYL-BUPIVACAINE-NACL 0.2-0.1-0.9 MG/100ML-% EP SOLN
0.2-0.1-0.9100- MG/100ML-% | EPIDURAL | Status: AC
Start: 2022-12-03 — End: 2022-12-05
  Administered 2022-12-03 – 2022-12-05 (×5): 12 mL/h via EPIDURAL

## 2022-12-03 MED ORDER — CARBOPROST TROMETHAMINE 250 MCG/ML IM SOLN
250 MCG/ML | INTRAMUSCULAR | Status: AC | PRN
Start: 2022-12-03 — End: 2022-12-05

## 2022-12-03 MED ORDER — OXYTOCIN 0.06 UNIT/ML BOLUS FROM THE BAG (POST-PARTUM)
30500 UNIT/500ML | INTRAVENOUS | Status: AC | PRN
Start: 2022-12-03 — End: 2022-12-05
  Administered 2022-12-05: 12:00:00 166.7 [IU] via INTRAVENOUS

## 2022-12-03 MED ORDER — NALOXONE HCL 0.4 MG/ML IJ SOLN
0.4 | Freq: Once | INTRAMUSCULAR | Status: AC | PRN
Start: 2022-12-03 — End: 2022-12-04

## 2022-12-03 MED ORDER — MISOPROSTOL 25 MCG PRE-SPLIT TABLET
25 | ORAL | Status: DC
Start: 2022-12-03 — End: 2022-12-03
  Administered 2022-12-03 (×2): 50 ug via ORAL

## 2022-12-03 MED ORDER — DINOPROSTONE 10 MG VA INST
10 | Freq: Once | VAGINAL | Status: AC
Start: 2022-12-03 — End: 2022-12-03
  Administered 2022-12-03: 10 mg via VAGINAL

## 2022-12-03 MED ORDER — DOCUSATE SODIUM 100 MG PO CAPS
100 MG | Freq: Two times a day (BID) | ORAL | Status: AC
Start: 2022-12-03 — End: 2022-12-05

## 2022-12-03 MED ORDER — NORMAL SALINE FLUSH 0.9 % IV SOLN
0.9 % | INTRAVENOUS | Status: AC | PRN
Start: 2022-12-03 — End: 2022-12-05

## 2022-12-03 MED ORDER — ONDANSETRON HCL 4 MG/2ML IJ SOLN
42 MG/2ML | Freq: Four times a day (QID) | INTRAMUSCULAR | Status: AC | PRN
Start: 2022-12-03 — End: 2022-12-05

## 2022-12-03 MED ORDER — MISOPROSTOL 25 MCG PRE-SPLIT TABLET
25 | ORAL | Status: AC
Start: 2022-12-03 — End: 2022-12-03
  Administered 2022-12-03: 14:00:00 25 ug via VAGINAL

## 2022-12-03 MED ORDER — LACTATED RINGERS IV SOLN
INTRAVENOUS | Status: AC
Start: 2022-12-03 — End: 2022-12-05
  Administered 2022-12-03 – 2022-12-05 (×4): via INTRAVENOUS

## 2022-12-03 MED ORDER — NORMAL SALINE FLUSH 0.9 % IV SOLN
0.9 % | Freq: Two times a day (BID) | INTRAVENOUS | Status: AC
Start: 2022-12-03 — End: 2022-12-05
  Administered 2022-12-04 (×2): 10 mL via INTRAVENOUS

## 2022-12-03 MED FILL — MISOPROSTOL 25 MCG PRE-SPLIT TABLET: 25 mcg | ORAL | Qty: 2

## 2022-12-03 MED FILL — BUTORPHANOL TARTRATE 2 MG/ML IJ SOLN: 2 MG/ML | INTRAMUSCULAR | Qty: 1

## 2022-12-03 MED FILL — MISOPROSTOL 25 MCG PRE-SPLIT TABLET: 25 mcg | ORAL | Qty: 1

## 2022-12-03 MED FILL — LACTATED RINGERS IV SOLN: INTRAVENOUS | Qty: 500

## 2022-12-03 MED FILL — CERVIDIL 10 MG VA INST: 10 MG | VAGINAL | Qty: 1

## 2022-12-03 MED FILL — LACTATED RINGERS IV SOLN: INTRAVENOUS | Qty: 1000

## 2022-12-03 MED FILL — FENTANYL-BUPIVACAINE-NACL 0.2-0.1-0.9 MG/100ML-% EP SOLN: EPIDURAL | Qty: 100

## 2022-12-03 MED FILL — FENTANYL CITRATE (PF) 100 MCG/2ML IJ SOLN: 100 MCG/2ML | INTRAMUSCULAR | Qty: 2

## 2022-12-03 MED FILL — ACETAMINOPHEN 325 MG PO TABS: 325 MG | ORAL | Qty: 2

## 2022-12-03 NOTE — Progress Notes (Signed)
5284 SBAr received from S. Daphane Shepherd, RN. Care assumed at this time.

## 2022-12-03 NOTE — Progress Notes (Addendum)
L&D Progress Note    Pt is resting comfortably without epidural. No complaints.    Patient Vitals for the past 24 hrs:   BP Temp Temp src Pulse Resp SpO2 Height Weight   12/03/22 1550 -- -- -- -- -- 99 % -- --   12/03/22 1545 -- -- -- -- -- 100 % -- --   12/03/22 1543 139/76 -- -- 80 -- -- -- --   12/03/22 1540 -- -- -- -- -- 100 % -- --   12/03/22 1535 -- -- -- -- -- 100 % -- --   12/03/22 1530 -- -- -- -- -- 98 % -- --   12/03/22 1525 -- -- -- -- -- 100 % -- --   12/03/22 1520 -- -- -- -- -- 100 % -- --   12/03/22 1515 -- -- -- -- -- 100 % -- --   12/03/22 1510 -- -- -- -- -- 100 % -- --   12/03/22 1500 -- -- -- -- -- 99 % -- --   12/03/22 1455 -- -- -- -- -- 97 % -- --   12/03/22 1450 -- -- -- -- -- 98 % -- --   12/03/22 1445 -- -- -- -- -- 98 % -- --   12/03/22 1440 -- -- -- -- -- 99 % -- --   12/03/22 1435 -- -- -- -- -- 99 % -- --   12/03/22 1430 -- -- -- -- -- 98 % -- --   12/03/22 1425 -- -- -- -- -- 99 % -- --   12/03/22 1423 128/75 -- -- 71 -- -- -- --   12/03/22 1250 -- -- -- -- -- 98 % -- --   12/03/22 1245 -- -- -- -- -- 98 % -- --   12/03/22 1240 -- -- -- -- -- 97 % -- --   12/03/22 1235 -- -- -- -- -- 98 % -- --   12/03/22 1232 (!) 126/58 -- -- 73 -- -- -- --   12/03/22 1225 -- -- -- -- -- 95 % -- --   12/03/22 1220 -- -- -- -- -- 96 % -- --   12/03/22 1150 -- -- -- -- -- 96 % -- --   12/03/22 1145 -- -- -- -- -- 96 % -- --   12/03/22 1142 112/60 98.2 F (36.8 C) Oral 71 16 98 % -- --   12/03/22 1140 -- -- -- -- -- 96 % -- --   12/03/22 1135 -- -- -- -- -- 96 % -- --   12/03/22 1130 -- -- -- -- -- 97 % -- --   12/03/22 1125 -- -- -- -- -- 95 % -- --   12/03/22 1120 -- -- -- -- -- 96 % -- --   12/03/22 1115 -- -- -- -- -- 95 % -- --   12/03/22 1110 -- -- -- -- -- 96 % -- --   12/03/22 1105 -- -- -- -- -- 96 % -- --   12/03/22 1100 -- -- -- -- -- 96 % -- --   12/03/22 1055 -- -- -- -- -- 96 % -- --   12/03/22 1050 -- -- -- -- -- 96 % -- --   12/03/22 1045 -- -- -- -- -- 96 % -- --   12/03/22 1040 -- --  -- -- -- 96 % -- --   12/03/22 1035 -- -- -- -- -- 97 % -- --   12/03/22 1033  115/70 -- -- 55 -- -- -- --   12/03/22 1030 -- -- -- -- -- 97 % -- --   12/03/22 1025 -- -- -- -- -- 99 % -- --   12/03/22 1020 -- -- -- -- -- 97 % -- --   12/03/22 1015 -- -- -- -- -- 99 % -- --   12/03/22 1010 -- -- -- -- -- 99 % -- --   12/03/22 1005 -- -- -- -- -- 99 % -- --   12/03/22 1000 -- -- -- -- -- 98 % -- --   12/03/22 0955 -- -- -- -- -- 99 % -- --   12/03/22 0951 (!) 143/90 -- -- 74 -- -- -- --   12/03/22 0950 -- -- -- -- -- 99 % -- --   12/03/22 0945 -- -- -- -- -- 99 % -- --   12/03/22 0940 -- -- -- -- -- 98 % -- --   12/03/22 0935 -- -- -- -- -- 98 % -- --   12/03/22 0930 -- -- -- -- -- 98 % -- --   12/03/22 0925 -- -- -- -- -- 99 % -- --   12/03/22 0920 -- -- -- -- -- 98 % -- --   12/03/22 0915 -- -- -- -- -- 99 % -- --   12/03/22 0910 -- -- -- -- -- 98 % -- --   12/03/22 0905 -- -- -- -- -- 98 % -- --   12/03/22 0900 -- -- -- -- -- 99 % -- --   12/03/22 0855 -- -- -- -- -- 99 % -- --   12/03/22 0850 -- -- -- -- -- 100 % -- --   12/03/22 0840 -- -- -- -- -- 100 % -- --   12/03/22 0839 137/67 -- -- 74 -- -- -- --   12/03/22 0830 -- -- -- -- -- 99 % -- --   12/03/22 0825 -- -- -- -- -- 99 % -- --   12/03/22 0820 -- -- -- -- -- 98 % -- --   12/03/22 0815 -- -- -- -- -- 96 % -- --   12/03/22 0810 -- -- -- -- -- 96 % -- --   12/03/22 0805 -- -- -- -- -- 96 % -- --   12/03/22 0800 -- -- -- -- -- 97 % -- --   12/03/22 0725 -- -- -- -- -- 97 % -- --   12/03/22 0720 -- -- -- -- -- 98 % -- --   12/03/22 0717 (!) 141/60 98.2 F (36.8 C) Oral 65 16 98 % -- --   12/03/22 0710 -- -- -- -- -- 98 % -- --   12/03/22 0706 -- -- -- -- -- 99 % -- --   12/03/22 0705 -- -- -- -- -- 99 % -- --   12/03/22 0700 -- -- -- -- -- 99 % -- --   12/03/22 0524 (!) 137/91 -- -- 83 -- 97 % -- --   12/03/22 0125 134/79 98.6 F (37 C) Oral (!) 101 18 98 % -- --   12/03/22 0031 115/65 -- -- 91 -- -- -- --   12/03/22 0001 130/76 -- -- 78 -- 100 % -- --    12/02/22 2334 132/68 -- -- 89 -- -- -- --   12/02/22 2312 -- -- -- 82 -- 100 % -- --   12/02/22 2307 111/76 98.6 F (37 C) Oral (!) 101 18  100 % -- --   12/02/22 2212 -- -- -- -- -- -- 1.676 m (5\' 6" ) 101.6 kg (224 lb)   12/02/22 2200 -- -- -- -- -- 99 % -- --   12/02/22 2155 -- -- -- -- -- 100 % -- --   12/02/22 2150 -- -- -- -- -- 100 % -- --   12/02/22 2146 130/80 -- -- 89 -- -- -- --   12/02/22 2145 -- -- -- -- -- 99 % -- --   12/02/22 2140 -- -- -- -- -- 99 % -- --   12/02/22 2134 -- -- -- -- -- 99 % -- --   12/02/22 2131 129/81 -- -- 92 -- -- -- --   12/02/22 2124 -- -- -- -- -- 100 % -- --   12/02/22 2120 -- -- -- -- -- 100 % -- --   12/02/22 2116 139/79 -- -- 91 -- -- -- --   12/02/22 2115 -- -- -- -- -- 100 % -- --   12/02/22 2110 -- -- -- -- -- 100 % -- --   12/02/22 2105 -- -- -- -- -- 100 % -- --   12/02/22 2101 134/85 -- -- 97 -- -- -- --   12/02/22 2100 -- -- -- -- -- 100 % -- --   12/02/22 2055 -- -- -- -- -- 99 % -- --   12/02/22 2045 137/87 -- -- 97 -- 98 % -- --   12/02/22 2040 -- -- -- -- -- 99 % -- --   12/02/22 2035 -- -- -- -- -- -- 1.676 m (5\' 6" ) 101.6 kg (224 lb)   12/02/22 2030 (!) 136/92 98.3 F (36.8 C) Oral 100 16 98 % -- --         Fetal Assessment:    Baseline: 135 bpm  Variability: moderate  Accelerations: present  Decelerations: absent    Contractions: irregular    Cervix: 1/50/-3  s/p oral cytotec x2      Assessment & Plan    37 y.o. [id number] at [redacted]w[redacted]d with mild preeclampsia undergoing induction of labor. Will give one-two doses of vaginal cytotec PRN followed by pitocin. GBS negative. Anticipate NSVD. CS for the usual indications. Overall maternal and fetal status reassuring.                     Scotty Weigelt Bunnie Philips II MD  0800

## 2022-12-03 NOTE — Anesthesia Procedure Notes (Signed)
Epidural Block    Patient location during procedure: OB  Start time: 12/03/2022 4:48 PM  End time: 12/03/2022 5:07 PM  Reason for block: labor epidural  Staffing  Performed: resident/CRNA   Anesthesiologist: Bufford Spikes, MD  Resident/CRNA: Dub Amis, APRN - CRNA  Performed by: Dub Amis, APRN - CRNA  Authorized by: Bufford Spikes, MD    Epidural  Patient position: sitting  Prep: Betadine and site prepped and draped  Patient monitoring: continuous pulse ox and frequent blood pressure checks  Approach: midline  Location: L4-5  Injection technique: LOR saline  Guidance: anatomical landmarks  Provider prep: mask and sterile gloves  Needle  Needle type: Tuohy   Needle gauge: 18 G  Needle length: 3.5 in  Needle insertion depth: 7 cm  Catheter type: multi-orifice  Catheter size: 20 G  Catheter at skin depth: 12 cm  Test dose: negativeCatheter Secured: tegaderm and tape  Assessment  Sensory level: improved.  Events: blood aspirated  Hemodynamics: stable  Attempts: 2  Outcomes: uncomplicated and patient tolerated procedure well  Additional Notes  1st attempt - blood aspirated through catheter; catheter removed, tip intact  2nd attempt - successful placement of epidural catheter   Preanesthetic Checklist  Completed: patient identified, IV checked, site marked, risks and benefits discussed, surgical/procedural consents, equipment checked, pre-op evaluation, timeout performed, anesthesia consent given, oxygen available, monitors applied/VS acknowledged, fire risk safety assessment completed and verbalized and blood product R/B/A discussed and consented

## 2022-12-03 NOTE — Progress Notes (Signed)
1704 This RN at the bedside for epidural attempt #2    Start 1704   Catheter threaded 1705   Test dose 1707

## 2022-12-03 NOTE — Progress Notes (Addendum)
@  0915-Patient asking to eat breakfast. Per Dr. Pearline Cables II patient may have a light meal.    @1350 -Dr. Pearline Cables II at bedside, cervical exam unchanged. Verbal orders received for 82mcg vaginal cytotec x1 dose. Patient verbalizes understanding and agrees with POC.    @1430 -Patient asking to eat lunch. Per Dr. Pearline Cables II patient may eat a light meal.      Patient requesting epidural. [place] CRNA notified @ 1630    In room @ 1640  Time out @ 1648  Test dose @ NA    @1805 -Per Dr. Pearline Cables II if patient is not >2cm would like cervidil placed. RN to check patients cervix.    @1820 -Dr. Pearline Cables II updated on cervical check. Order received for cervidil. Patient updated on POC, verbalizes understanding and agrees.     @1915 -SBAR given to Sandria Senter RN

## 2022-12-03 NOTE — Progress Notes (Signed)
Dr. Pearline Cables II at the bedside to see patient.  MD would like to give next dose of cytotec vaginally (25 mcg).

## 2022-12-03 NOTE — Anesthesia Pre-Procedure Evaluation (Signed)
Department of Anesthesiology  Preprocedure Note       Name:  Michelle Mcintyre   Age:  37 y.o.  DOB:  1985-12-04                                          MRN:  [id number]         Date:  12/03/2022      Surgeon: * No surgeons listed *    Procedure: * No procedures listed *    Medications prior to admission:   Prior to Admission medications    Medication Sig Start Date End Date Taking? Authorizing Provider   Prenatal MV-Min-Fe Fum-FA-DHA (PRENATAL 1 PO) Take by mouth    [provider]       Current medications:    Current Facility-Administered Medications   Medication Dose Route Frequency Provider Last Rate Last Admin    acetaminophen (TYLENOL) tablet 650 mg  650 mg Oral Q4H PRN Pearline Cables, Derwin P Sr., MD   650 mg at 12/03/22 0140    fentaNYL 2 mcg/mL BUPivacaine 0.1% in sodium chloride 0.9% 100 mL 0.2-0.1-0.9 MG/100ML-% epidural infusion             lactated ringers IV soln infusion   IntraVENous Continuous Moody Bruins, MD 125 mL/hr at 12/03/22 1415 New Bag at 12/03/22 1415    lactated ringers bolus 500 mL  500 mL IntraVENous PRN Moody Bruins, MD        Or    lactated ringers bolus 1,000 mL  1,000 mL IntraVENous PRN Moody Bruins, MD        sodium chloride flush 0.9 % injection 5-40 mL  5-40 mL IntraVENous 2 times per day Moody Bruins, MD        sodium chloride flush 0.9 % injection 5-40 mL  5-40 mL IntraVENous PRN Moody Bruins, MD        0.9 % sodium chloride infusion  25 mL IntraVENous PRN Moody Bruins, MD        methylergonovine (METHERGINE) injection 200 mcg  200 mcg IntraMUSCular PRN Moody Bruins, MD        carboprost (HEMABATE) injection 250 mcg  250 mcg IntraMUSCular PRN Moody Bruins, MD        miSOPROStol (CYTOTEC) tablet 400 mcg  400 mcg Buccal PRN Moody Bruins, MD        tranexamic acid (CYKLOKAPRON) 1,000 mg in sodium chloride 0.9 % 110 mL IVPB (mini-bag)  1,000 mg IntraVENous Once PRN Moody Bruins, MD        oxytocin (PITOCIN) 30 units in 500 mL infusion   87.3 milli-units/min IntraVENous Continuous PRN Moody Bruins, MD        And    oxytocin (PITOCIN) 10 unit bolus from the bag  10 Units IntraVENous PRN Moody Bruins, MD        terbutaline (BRETHINE) injection 0.25 mg  0.25 mg SubCUTAneous Once PRN Moody Bruins, MD        ondansetron [facility]) injection 4 mg  4 mg IntraVENous Q6H PRN Moody Bruins, MD        Or    ondansetron (ZOFRAN-ODT) disintegrating tablet 4 mg  4 mg Oral Q6H PRN Moody Bruins, MD        docusate sodium (COLACE) capsule 100 mg  100 mg Oral BID Lenn Sink,  Guadalupe Dawn, MD        fentaNYL (SUBLIMAZE) injection 25 mcg  25 mcg IntraVENous Q1H PRN Moody Bruins, MD   25 mcg at 12/03/22 1419       Allergies:  No Known Allergies    Problem List:    Patient Active Problem List   Diagnosis Code    Pre-eclampsia in third trimester O14.93       Past Medical History:        Diagnosis Date    AMA (advanced maternal age) multigravida 35+     Heart murmur     Subchorionic hematoma in first trimester        Past Surgical History:        Procedure Laterality Date    ECTOPIC PREGNANCY SURGERY  2009       Social History:    Social History     Tobacco Use    Smoking status: Former     Types: Cigarettes    Smokeless tobacco: Former   Substance Use Topics    Alcohol use: Not Currently                                Counseling given: Not Answered      Vital Signs (Current):   Vitals:    12/03/22 1540 12/03/22 1543 12/03/22 1545 12/03/22 1550   BP:  139/76     Pulse:  80     Resp:       Temp:       TempSrc:       SpO2: 100%  100% 99%   Weight:       Height:                                                  BP Readings from Last 3 Encounters:   12/03/22 139/76   12/01/22 111/60   09/12/22 134/76       NPO Status:                                                                                 BMI:   Wt Readings from Last 3 Encounters:   12/02/22 101.6 kg (224 lb)   12/01/22 101.6 kg (224 lb)   09/12/22 92.5 kg (204 lb)     Body mass index is 36.15  kg/m.    CBC:   Lab Results   Component Value Date/Time    WBC 18.1 12/02/2022 08:53 PM    RBC 4.18 12/02/2022 08:53 PM    HGB 11.9 12/02/2022 08:53 PM    HCT 35.5 12/02/2022 08:53 PM    MCV 84.9 12/02/2022 08:53 PM    RDW 43.1 12/02/2022 08:53 PM    PLT 288 12/02/2022 08:53 PM       CMP:   Lab Results   Component Value Date/Time    NA 138 12/02/2022 08:53 PM    K 3.7 12/02/2022 08:53 PM    CL 106 12/02/2022 08:53 PM  CO2 21 12/02/2022 08:53 PM    BUN 6 12/02/2022 08:53 PM    CREATININE 0.57 12/02/2022 08:53 PM    GFRAA >60.0 12/02/2022 08:53 PM    AGRATIO 0.9 06/01/2022 10:25 AM    LABGLOM >60 12/02/2022 08:53 PM    LABGLOM >60 06/01/2022 10:25 AM    GLUCOSE 77 12/02/2022 08:53 PM    PROT 7.6 06/01/2022 10:25 AM    CALCIUM 9.4 12/02/2022 08:53 PM    BILITOT 0.40 12/02/2022 08:53 PM    ALKPHOS 198 12/02/2022 08:53 PM    AST 22.0 12/02/2022 08:53 PM    ALT 10 12/02/2022 08:53 PM       POC Tests: No results for input(s): "POCGLU", "POCNA", "POCK", "POCCL", "POCBUN", "POCHEMO", "POCHCT" in the last 72 hours.    Coags: No results found for: "PROTIME", "INR", "APTT"    HCG (If Applicable):   Lab Results   Component Value Date    HCGQUANT 50,535 (H) 06/01/2022        ABGs: No results found for: "PHART", "PO2ART", "PCO2ART", "HCO3ART", "BEART", "O2SATART"     Type & Screen (If Applicable):  No results found for: "LABABO", "[place]"    Drug/Infectious Status (If Applicable):  No results found for: "HIV", "HEPCAB"    COVID-19 Screening (If Applicable):   Lab Results   Component Value Date/Time    COVID19 NEGATIVE 09/12/2022 08:40 PM           Anesthesia Evaluation  Patient summary reviewed and Nursing notes reviewed  Airway: Mallampati: III          Dental: normal exam         Pulmonary:Negative Pulmonary ROS and normal exam                               Cardiovascular:    (+) hypertension:, valvular problems/murmurs:         Beta Blocker:  Not on Beta Blocker         Neuro/Psych:   (+) headaches:             GI/Hepatic/Renal: Neg GI/Hepatic/Renal ROS            Endo/Other:    (+) blood dyscrasia: anemia:., electrolyte abnormalities.                 Abdominal:              PE comment: deferred   Vascular: negative vascular ROS.         Other Findings:             Anesthesia Plan      epidural     ASA 3             Anesthetic plan and risks discussed with patient.      Plan discussed with attending.          Post-op pain plan if not by surgeon: epidural opioid            Dub Amis, APRN - CRNA   12/03/2022

## 2022-12-03 NOTE — Progress Notes (Signed)
L&D Progress Note    Pt is resting comfortably without epidural. No complaints.    Patient Vitals for the past 24 hrs:   BP Temp Temp src Pulse Resp SpO2 Height Weight   12/03/22 1550 -- -- -- -- -- 99 % -- --   12/03/22 1545 -- -- -- -- -- 100 % -- --   12/03/22 1543 139/76 -- -- 80 -- -- -- --   12/03/22 1540 -- -- -- -- -- 100 % -- --   12/03/22 1535 -- -- -- -- -- 100 % -- --   12/03/22 1530 -- -- -- -- -- 98 % -- --   12/03/22 1525 -- -- -- -- -- 100 % -- --   12/03/22 1520 -- -- -- -- -- 100 % -- --   12/03/22 1515 -- -- -- -- -- 100 % -- --   12/03/22 1510 -- -- -- -- -- 100 % -- --   12/03/22 1500 -- -- -- -- -- 99 % -- --   12/03/22 1455 -- -- -- -- -- 97 % -- --   12/03/22 1450 -- -- -- -- -- 98 % -- --   12/03/22 1445 -- -- -- -- -- 98 % -- --   12/03/22 1440 -- -- -- -- -- 99 % -- --   12/03/22 1435 -- -- -- -- -- 99 % -- --   12/03/22 1430 -- -- -- -- -- 98 % -- --   12/03/22 1425 -- -- -- -- -- 99 % -- --   12/03/22 1423 128/75 -- -- 71 -- -- -- --   12/03/22 1250 -- -- -- -- -- 98 % -- --   12/03/22 1245 -- -- -- -- -- 98 % -- --   12/03/22 1240 -- -- -- -- -- 97 % -- --   12/03/22 1235 -- -- -- -- -- 98 % -- --   12/03/22 1232 (!) 126/58 -- -- 73 -- -- -- --   12/03/22 1225 -- -- -- -- -- 95 % -- --   12/03/22 1220 -- -- -- -- -- 96 % -- --   12/03/22 1150 -- -- -- -- -- 96 % -- --   12/03/22 1145 -- -- -- -- -- 96 % -- --   12/03/22 1142 112/60 98.2 F (36.8 C) Oral 71 16 98 % -- --   12/03/22 1140 -- -- -- -- -- 96 % -- --   12/03/22 1135 -- -- -- -- -- 96 % -- --   12/03/22 1130 -- -- -- -- -- 97 % -- --   12/03/22 1125 -- -- -- -- -- 95 % -- --   12/03/22 1120 -- -- -- -- -- 96 % -- --   12/03/22 1115 -- -- -- -- -- 95 % -- --   12/03/22 1110 -- -- -- -- -- 96 % -- --   12/03/22 1105 -- -- -- -- -- 96 % -- --   12/03/22 1100 -- -- -- -- -- 96 % -- --   12/03/22 1055 -- -- -- -- -- 96 % -- --   12/03/22 1050 -- -- -- -- -- 96 % -- --   12/03/22 1045 -- -- -- -- -- 96 % -- --   12/03/22 1040 -- --  -- -- -- 96 % -- --   12/03/22 1035 -- -- -- -- -- 97 % -- --   12/03/22 1033  115/70 -- -- 52 -- -- -- --   12/03/22 1030 -- -- -- -- -- 97 % -- --   12/03/22 1025 -- -- -- -- -- 99 % -- --   12/03/22 1020 -- -- -- -- -- 97 % -- --   12/03/22 1015 -- -- -- -- -- 99 % -- --   12/03/22 1010 -- -- -- -- -- 99 % -- --   12/03/22 1005 -- -- -- -- -- 99 % -- --   12/03/22 1000 -- -- -- -- -- 98 % -- --   12/03/22 0955 -- -- -- -- -- 99 % -- --   12/03/22 0951 (!) 143/90 -- -- 74 -- -- -- --   12/03/22 0950 -- -- -- -- -- 99 % -- --   12/03/22 0945 -- -- -- -- -- 99 % -- --   12/03/22 0940 -- -- -- -- -- 98 % -- --   12/03/22 0935 -- -- -- -- -- 98 % -- --   12/03/22 0930 -- -- -- -- -- 98 % -- --   12/03/22 0925 -- -- -- -- -- 99 % -- --   12/03/22 0920 -- -- -- -- -- 98 % -- --   12/03/22 0915 -- -- -- -- -- 99 % -- --   12/03/22 0910 -- -- -- -- -- 98 % -- --   12/03/22 0905 -- -- -- -- -- 98 % -- --   12/03/22 0900 -- -- -- -- -- 99 % -- --   12/03/22 0855 -- -- -- -- -- 99 % -- --   12/03/22 0850 -- -- -- -- -- 100 % -- --   12/03/22 0840 -- -- -- -- -- 100 % -- --   12/03/22 0839 137/67 -- -- 74 -- -- -- --   12/03/22 0830 -- -- -- -- -- 99 % -- --   12/03/22 0825 -- -- -- -- -- 99 % -- --   12/03/22 0820 -- -- -- -- -- 98 % -- --   12/03/22 0815 -- -- -- -- -- 96 % -- --   12/03/22 0810 -- -- -- -- -- 96 % -- --   12/03/22 0805 -- -- -- -- -- 96 % -- --   12/03/22 0800 -- -- -- -- -- 97 % -- --   12/03/22 0725 -- -- -- -- -- 97 % -- --   12/03/22 0720 -- -- -- -- -- 98 % -- --   12/03/22 0717 (!) 141/60 98.2 F (36.8 C) Oral 65 16 98 % -- --   12/03/22 0710 -- -- -- -- -- 98 % -- --   12/03/22 0706 -- -- -- -- -- 99 % -- --   12/03/22 0705 -- -- -- -- -- 99 % -- --   12/03/22 0700 -- -- -- -- -- 99 % -- --   12/03/22 0524 (!) 137/91 -- -- 83 -- 97 % -- --   12/03/22 0125 134/79 98.6 F (37 C) Oral (!) 101 18 98 % -- --   12/03/22 0031 115/65 -- -- 91 -- -- -- --   12/03/22 0001 130/76 -- -- 78 -- 100 % -- --    12/02/22 2334 132/68 -- -- 89 -- -- -- --   12/02/22 2312 -- -- -- 82 -- 100 % -- --   12/02/22 2307 111/76 98.6 F (37 C) Oral (!) 101 18  100 % -- --   12/02/22 2212 -- -- -- -- -- -- 1.676 m (5\' 6" ) 101.6 kg (224 lb)   12/02/22 2200 -- -- -- -- -- 99 % -- --   12/02/22 2155 -- -- -- -- -- 100 % -- --   12/02/22 2150 -- -- -- -- -- 100 % -- --   12/02/22 2146 130/80 -- -- 89 -- -- -- --   12/02/22 2145 -- -- -- -- -- 99 % -- --   12/02/22 2140 -- -- -- -- -- 99 % -- --   12/02/22 2134 -- -- -- -- -- 99 % -- --   12/02/22 2131 129/81 -- -- 92 -- -- -- --   12/02/22 2124 -- -- -- -- -- 100 % -- --   12/02/22 2120 -- -- -- -- -- 100 % -- --   12/02/22 2116 139/79 -- -- 91 -- -- -- --   12/02/22 2115 -- -- -- -- -- 100 % -- --   12/02/22 2110 -- -- -- -- -- 100 % -- --   12/02/22 2105 -- -- -- -- -- 100 % -- --   12/02/22 2101 134/85 -- -- 97 -- -- -- --   12/02/22 2100 -- -- -- -- -- 100 % -- --   12/02/22 2055 -- -- -- -- -- 99 % -- --   12/02/22 2045 137/87 -- -- 97 -- 98 % -- --   12/02/22 2040 -- -- -- -- -- 99 % -- --   12/02/22 2035 -- -- -- -- -- -- 1.676 m (5\' 6" ) 101.6 kg (224 lb)   12/02/22 2030 (!) 136/92 98.3 F (36.8 C) Oral 100 16 98 % -- --         Fetal Assessment:    Baseline: 135 bpm  Variability: moderate  Accelerations: present  Decelerations: absent    Contractions: irregular    Cervix: unchanged 1/50/-3         Assessment & Plan    37 y.o. [id number] at [redacted]w[redacted]d with mild preeclampsia undergoing induction of labor. Continue cervical ripening. GBS negative. Anticipate NSVD. CS for the usual indications. Overall maternal and fetal status reassuring.                               Deondra Labrador Bunnie Philips II MD  4:42 PM

## 2022-12-04 MED ORDER — OXYTOCIN 30 UNITS IN 500 ML INFUSION
30500 UNIT/500ML | INTRAVENOUS | Status: AC
Start: 2022-12-04 — End: 2022-12-05
  Administered 2022-12-04: 12:00:00 1 m[IU]/min via INTRAVENOUS
  Administered 2022-12-04: 8 m[IU]/min via INTRAVENOUS

## 2022-12-04 MED ORDER — CALCIUM CARBONATE ANTACID 500 MG PO CHEW
500 | Freq: Once | ORAL | Status: AC
Start: 2022-12-04 — End: 2022-12-04
  Administered 2022-12-04: 20:00:00 1000 mg via ORAL

## 2022-12-04 MED FILL — ANTACID CALCIUM 500 MG PO CHEW: 500 MG | ORAL | Qty: 2

## 2022-12-04 MED FILL — FENTANYL-BUPIVACAINE-NACL 0.2-0.1-0.9 MG/100ML-% EP SOLN: EPIDURAL | Qty: 100

## 2022-12-04 MED FILL — OXYTOCIN-SODIUM CHLORIDE 30-0.9 UT/500ML-% IV SOLN: INTRAVENOUS | Qty: 500

## 2022-12-04 NOTE — Progress Notes (Addendum)
Bedside shift change report given to R. Debbe Bales, Rn (Soil scientist) by Sandria Senter, RN (offgoing nurse). Report included the following information Nurse Handoff Report.   [id number] Cervidil removed. Provided hygiene materials to pt to clean up per request.   [phone] Paged Dr. Oliva Bustard  0800 Spoke with Dr. Oliva Bustard. Order received for pitocin.   [place] with Dr. Pearline Cables II. Updated on cervical exam. Continue pitocin at current rate. MD plans to come assess around 1700.     1614 Reviewed cxt with Dr. Pearline Cables II. Orders for pitocin break with TUMs and restart pitocin  1615 Educated pt on plan of care. Pt agreeable for plan of care.   [place] Dr. Pearline Cables II at bedside. SVE and AROM/blood tinged  1816 Bedside shift change report given to [facility]. S, RN (Soil scientist) by R. Debbe Bales, RN Engineer, drilling). Report included the following information Nurse Handoff Report.

## 2022-12-04 NOTE — Progress Notes (Signed)
L&D Progress Note    Pt is resting comfortably with epidural. No complaints.    Patient Vitals for the past 24 hrs:   BP Temp Temp src Pulse Resp SpO2   12/04/22 1815 -- 97.9 F (36.6 C) Oral -- -- --   12/04/22 1745 126/60 -- -- 85 -- 100 %   12/04/22 1731 130/65 -- -- 84 -- --   12/04/22 1730 -- -- -- -- -- 100 %   12/04/22 1715 (!) 140/94 -- -- 84 -- 99 %   12/04/22 1700 (!) 144/81 -- -- 75 -- 98 %   12/04/22 1645 130/77 -- -- 74 -- 98 %   12/04/22 1630 127/82 -- -- 75 -- 98 %   12/04/22 1620 -- -- -- -- -- 98 %   12/04/22 1616 (!) 141/84 -- -- 76 -- --   12/04/22 1615 -- -- -- -- -- 99 %   12/04/22 1610 -- -- -- -- -- 98 %   12/04/22 1605 -- -- -- -- -- 97 %   12/04/22 1601 133/70 -- -- 73 -- --   12/04/22 1600 -- -- -- -- -- 97 %   12/04/22 1555 -- -- -- -- -- 97 %   12/04/22 1550 -- -- -- -- -- 97 %   12/04/22 1546 135/76 -- -- 74 -- --   12/04/22 1545 -- -- -- -- -- 98 %   12/04/22 1540 -- -- -- -- -- 98 %   12/04/22 1535 -- -- -- -- -- 98 %   12/04/22 1530 (!) 147/88 98 F (36.7 C) Oral 85 -- 99 %   12/04/22 1525 -- -- -- -- -- 99 %   12/04/22 1520 -- -- -- -- -- 99 %   12/04/22 1516 139/84 -- -- 85 -- --   12/04/22 1515 -- -- -- -- -- 99 %   12/04/22 1510 -- -- -- -- -- 98 %   12/04/22 1501 129/70 -- -- 79 -- --   12/04/22 1500 -- -- -- -- -- 98 %   12/04/22 1446 128/79 -- -- 78 -- --   12/04/22 1445 -- -- -- -- -- 99 %   12/04/22 1431 137/79 -- -- 76 -- --   12/04/22 1430 -- -- -- -- -- 98 %   12/04/22 1401 131/77 -- -- 84 -- --   12/04/22 1400 -- -- -- -- -- 100 %   12/04/22 1355 -- -- -- -- -- 99 %   12/04/22 1346 119/65 -- -- 72 -- --   12/04/22 1345 -- -- -- -- -- 98 %   12/04/22 1331 115/63 -- -- 79 -- --   12/04/22 1330 -- -- -- -- -- 96 %   12/04/22 1316 130/62 -- -- 78 -- --   12/04/22 1315 -- -- -- -- -- 96 %   12/04/22 1305 -- -- -- -- -- 96 %   12/04/22 1301 122/65 -- -- 75 -- --   12/04/22 1246 123/64 -- -- 72 -- --   12/04/22 1245 -- -- -- -- -- 98 %   12/04/22 1231 121/75 -- -- 80 -- --    12/04/22 1230 -- -- -- -- -- 98 %   12/04/22 1216 132/71 -- -- 85 -- --   12/04/22 1215 -- -- -- -- -- 99 %   12/04/22 1210 -- -- -- -- -- 99 %   12/04/22 1205 -- -- -- -- -- 99 %  12/04/22 1202 123/69 -- -- 80 -- --   12/04/22 1201 123/69 -- -- 80 -- --   12/04/22 1146 127/73 -- -- 74 -- --   12/04/22 1145 -- -- -- -- -- 100 %   12/04/22 1140 -- -- -- -- -- 99 %   12/04/22 1135 -- -- -- -- -- 98 %   12/04/22 1131 131/79 98.6 F (37 C) Oral 73 -- --   12/04/22 1130 -- -- -- -- -- 99 %   12/04/22 1116 125/64 -- -- 77 -- --   12/04/22 1115 -- -- -- -- -- 99 %   12/04/22 1101 116/63 -- -- 78 -- --   12/04/22 1100 -- -- -- -- -- 99 %   12/04/22 1051 119/72 -- -- 87 -- --   12/04/22 1050 -- -- -- -- -- 100 %   12/04/22 0920 -- -- -- -- -- 99 %   12/04/22 0916 133/67 -- -- 79 -- --   12/04/22 0915 -- -- -- -- -- 100 %   12/04/22 0851 124/67 -- -- 69 -- --   12/04/22 0850 -- -- -- -- -- 99 %   12/04/22 0820 -- -- -- -- -- 99 %   12/04/22 0816 118/73 -- -- 90 -- --   12/04/22 0801 (!) 145/79 -- -- 82 -- --   12/04/22 0750 -- -- -- -- -- 99 %   12/04/22 0746 127/76 -- -- 78 -- --   12/04/22 0731 124/70 -- -- 72 -- --   12/04/22 0720 131/67 98.6 F (37 C) Oral 75 16 99 %   12/04/22 0655 -- -- -- -- -- 99 %   12/04/22 0650 -- -- -- -- -- 98 %   12/04/22 0645 -- -- -- -- -- 99 %   12/04/22 0640 -- -- -- -- -- 98 %   12/04/22 0635 -- -- -- -- -- 99 %   12/04/22 0630 -- -- -- -- -- 96 %   12/04/22 0625 -- -- -- -- -- 97 %   12/04/22 0620 -- -- -- -- -- 97 %   12/04/22 0615 -- -- -- -- -- 97 %   12/04/22 0610 -- -- -- -- -- 97 %   12/04/22 0605 -- -- -- -- -- 97 %   12/04/22 0600 -- -- -- -- -- 96 %   12/04/22 0555 -- -- -- -- -- 97 %   12/04/22 0550 -- -- -- -- -- 98 %   12/04/22 0545 -- -- -- -- -- 98 %   12/04/22 0540 -- -- -- -- -- 97 %   12/04/22 0535 -- -- -- -- -- 98 %   12/04/22 0531 (!) 120/51 -- -- 84 -- --   12/04/22 0530 -- -- -- -- -- 98 %   12/04/22 0525 -- -- -- -- -- 98 %   12/04/22 0520 -- -- -- -- -- 98 %    12/04/22 0516 (!) 110/50 -- -- 77 -- --   12/04/22 0515 -- -- -- -- -- 98 %   12/04/22 0510 -- -- -- -- -- 98 %   12/04/22 0505 -- -- -- -- -- 98 %   12/04/22 0501 (!) 114/47 -- -- 74 -- --   12/04/22 0500 -- -- -- -- -- 98 %   12/04/22 0455 -- -- -- -- -- 98 %   12/04/22 0450 -- -- -- -- -- 98 %  12/04/22 0446 (!) 106/53 -- -- 86 -- --   12/04/22 0445 -- -- -- -- -- 98 %   12/04/22 0440 -- -- -- -- -- 98 %   12/04/22 0435 -- -- -- -- -- 98 %   12/04/22 0431 (!) 111/47 -- -- 78 -- --   12/04/22 0430 -- -- -- -- -- 97 %   12/04/22 0425 -- -- -- -- -- 97 %   12/04/22 0420 -- -- -- -- -- 98 %   12/04/22 0416 (!) 109/48 -- -- 80 -- --   12/04/22 0415 -- -- -- -- -- 97 %   12/04/22 0410 -- -- -- -- -- 97 %   12/04/22 0405 -- -- -- -- -- 97 %   12/04/22 0401 (!) 111/52 -- -- 80 -- --   12/04/22 0400 -- -- -- -- -- 98 %   12/04/22 0355 -- -- -- -- -- 98 %   12/04/22 0350 -- -- -- -- -- 98 %   12/04/22 0346 (!) 108/51 -- -- 73 -- --   12/04/22 0345 -- -- -- -- -- 99 %   12/04/22 0340 -- -- -- -- -- 100 %   12/04/22 0335 -- -- -- -- -- 100 %   12/04/22 0331 (!) 123/58 -- -- 74 -- --   12/04/22 0330 -- -- -- -- -- 100 %   12/04/22 0325 -- -- -- -- -- 100 %   12/04/22 0320 -- -- -- -- -- 100 %   12/04/22 0316 113/63 -- -- 76 -- --   12/04/22 0315 -- -- -- -- -- 100 %   12/04/22 0310 -- -- -- -- -- 99 %   12/04/22 0305 -- -- -- -- -- 98 %   12/04/22 0301 132/70 -- -- 91 -- --   12/04/22 0300 -- -- -- -- -- 97 %   12/04/22 0255 -- -- -- -- -- 99 %   12/04/22 0250 -- -- -- -- -- 98 %   12/04/22 0246 129/62 -- -- 79 -- --   12/04/22 0245 -- -- -- -- -- 98 %   12/04/22 0240 -- -- -- -- -- 98 %   12/04/22 0235 -- -- -- -- -- 99 %   12/04/22 0231 (!) 141/90 -- -- 86 -- --   12/04/22 0230 -- -- -- -- -- 99 %   12/04/22 0225 -- -- -- -- -- 98 %   12/04/22 0220 -- -- -- -- -- 97 %   12/04/22 0216 136/83 -- -- 89 -- --   12/04/22 0215 -- -- -- -- -- 98 %   12/04/22 0210 -- -- -- -- -- 98 %   12/04/22 0205 -- -- -- -- -- 98 %    12/04/22 0201 128/75 -- -- 80 -- --   12/04/22 0200 -- -- -- -- -- 98 %   12/04/22 0155 -- -- -- -- -- 99 %   12/04/22 0150 -- -- -- -- -- 97 %   12/04/22 0146 114/64 -- -- 91 -- --   12/04/22 0145 -- -- -- -- -- 97 %   12/04/22 0140 -- -- -- -- -- 96 %   12/04/22 0135 -- -- -- -- -- 97 %   12/04/22 0131 124/60 -- -- 78 -- --   12/04/22 0130 -- -- -- -- -- 97 %   12/04/22 0125 -- -- -- -- -- 96 %   12/04/22   0120 -- -- -- -- -- 96 %   12/04/22 0116 (!) 113/55 -- -- 87 -- --   12/04/22 0115 -- -- -- -- -- 95 %   12/04/22 0110 -- -- -- -- -- 96 %   12/04/22 0105 -- -- -- -- -- 97 %   12/04/22 0101 126/60 -- -- 77 -- --   12/04/22 0100 -- -- -- -- -- 97 %   12/04/22 0055 -- -- -- -- -- 96 %   12/04/22 0050 -- -- -- -- -- 97 %   12/04/22 0046 115/69 -- -- 79 -- --   12/04/22 0045 -- -- -- -- -- 97 %   12/04/22 0040 -- -- -- -- -- 97 %   12/04/22 0035 -- -- -- -- -- 98 %   12/04/22 0031 123/65 -- -- 77 -- --   12/04/22 0030 -- -- -- -- -- 99 %   12/04/22 0028 126/69 -- -- 85 -- --   12/04/22 0025 -- -- -- -- -- 99 %   12/04/22 0015 -- -- -- -- -- 96 %   12/04/22 0010 -- -- -- -- -- 96 %   12/04/22 0005 -- -- -- -- -- 97 %   12/04/22 0000 -- -- -- -- -- 98 %   12/03/22 1901 128/63 -- -- 74 -- --   12/03/22 1900 -- -- -- -- -- 99 %   12/03/22 1855 -- -- -- -- -- 100 %   12/03/22 1850 -- -- -- -- -- 99 %   12/03/22 1846 125/68 -- -- 79 -- --   12/03/22 1845 -- -- -- -- -- 98 %   12/03/22 1840 -- -- -- -- -- 100 %   12/03/22 1835 -- -- -- -- -- 99 %   12/03/22 1831 (!) 140/72 -- -- 85 -- --   12/03/22 1830 -- -- -- -- -- 100 %   12/03/22 1825 -- -- -- -- -- 100 %   12/03/22 1820 -- -- -- -- -- 100 %         Fetal Assessment:    Baseline: 140 bpm  Variability: moderate  Accelerations: present  Decelerations: absent    Contractions: irregular    Cervix: 3/60/-3 midposition  AROM scant blood tinged       Pitocin @ 2 milli-units/min      Assessment & Plan    37 y.o. [id number] at [redacted]w[redacted]d with mild preeclampsia undergoing induction  of labor. Prolonged ripening, but now AROMed on pit. GBS negative. Anticipate NSVD. CS for the usual indications. Overall maternal and fetal status reassuring.                     Anberlin Diez Bunnie Philips II MD  6:18 PM

## 2022-12-04 NOTE — Progress Notes (Addendum)
25- SBAR report received from R. Contractor.     2200- Dr. Pearline Cables II AT B/S SVE 4/60/-2

## 2022-12-05 MED ORDER — RHO D IMMUNE GLOBULIN 1500 UNITS IM (WRAPPER)
1500 units | Freq: Once | INTRAMUSCULAR | Status: DC | PRN
Start: 2022-12-05 — End: 2022-12-05

## 2022-12-05 MED ORDER — MISOPROSTOL 200 MCG PO TABS
200 MCG | ORAL | Status: DC | PRN
Start: 2022-12-05 — End: 2022-12-07

## 2022-12-05 MED ORDER — OXYTOCIN 30 UNITS IN 500 ML INFUSION
30 | INTRAVENOUS | Status: AC | PRN
Start: 2022-12-05 — End: 2022-12-07
  Administered 2022-12-05: 13:00:00 87.3 m[IU]/min via INTRAVENOUS

## 2022-12-05 MED ORDER — WITCH HAZEL-GLYCERIN EX PADS
CUTANEOUS | Status: DC | PRN
Start: 2022-12-05 — End: 2022-12-07
  Administered 2022-12-05: 20:00:00 via TOPICAL

## 2022-12-05 MED ORDER — SENNA-DOCUSATE SODIUM 8.6-50 MG PO TABS
8.6-50 MG | Freq: Every day | ORAL | Status: DC | PRN
Start: 2022-12-05 — End: 2022-12-07

## 2022-12-05 MED ORDER — SODIUM CHLORIDE 0.9 % IV SOLN
0.9 % | INTRAVENOUS | Status: DC | PRN
Start: 2022-12-05 — End: 2022-12-07

## 2022-12-05 MED ORDER — ACETAMINOPHEN 500 MG PO TABS
500 MG | Freq: Three times a day (TID) | ORAL | Status: DC
Start: 2022-12-05 — End: 2022-12-07
  Administered 2022-12-05 – 2022-12-07 (×6): 1000 mg via ORAL

## 2022-12-05 MED ORDER — LANSINOH LANOLIN EX CREA
CUTANEOUS | Status: DC | PRN
Start: 2022-12-05 — End: 2022-12-07

## 2022-12-05 MED ORDER — TRANEXAMIC ACID 1000 MG/10ML IV SOLN
1000 MG/10ML | Freq: Once | INTRAVENOUS | Status: AC | PRN
Start: 2022-12-05 — End: 2022-12-06

## 2022-12-05 MED ORDER — TETANUS-DIPHTH-ACELL PERTUSSIS 5-2.5-18.5 LF-MCG/0.5 IM SUSY
INTRAMUSCULAR | Status: AC
Start: 2022-12-05 — End: 2022-12-06
  Administered 2022-12-06: 23:00:00 0.5 mL via INTRAMUSCULAR

## 2022-12-05 MED ORDER — OXYCODONE HCL 5 MG PO TABS
5 MG | ORAL | Status: DC | PRN
Start: 2022-12-05 — End: 2022-12-07

## 2022-12-05 MED ORDER — METHYLERGONOVINE MALEATE 0.2 MG/ML IJ SOLN
0.2 MG/ML | INTRAMUSCULAR | Status: DC | PRN
Start: 2022-12-05 — End: 2022-12-07

## 2022-12-05 MED ORDER — DOCUSATE SODIUM 100 MG PO CAPS
100 MG | Freq: Two times a day (BID) | ORAL | Status: DC
Start: 2022-12-05 — End: 2022-12-07
  Administered 2022-12-05 – 2022-12-07 (×5): 100 mg via ORAL

## 2022-12-05 MED ORDER — OXYCODONE HCL 5 MG PO TABS
5 MG | ORAL | Status: DC | PRN
Start: 2022-12-05 — End: 2022-12-07
  Administered 2022-12-05 – 2022-12-06 (×2): 10 mg via ORAL

## 2022-12-05 MED ORDER — BENZOCAINE-MENTHOL 20-0.5 % EX AERO
20-0.5 % | CUTANEOUS | Status: DC | PRN
Start: 2022-12-05 — End: 2022-12-07
  Administered 2022-12-05: 20:00:00 via TOPICAL

## 2022-12-05 MED ORDER — NORMAL SALINE FLUSH 0.9 % IV SOLN
0.9 | Freq: Two times a day (BID) | INTRAVENOUS | Status: DC
Start: 2022-12-05 — End: 2022-12-06

## 2022-12-05 MED ORDER — FAMOTIDINE 20 MG PO TABS
20 MG | Freq: Two times a day (BID) | ORAL | Status: DC | PRN
Start: 2022-12-05 — End: 2022-12-07

## 2022-12-05 MED ORDER — IBUPROFEN 400 MG PO TABS
400 MG | Freq: Three times a day (TID) | ORAL | Status: DC
Start: 2022-12-05 — End: 2022-12-07
  Administered 2022-12-05 – 2022-12-07 (×6): 800 mg via ORAL

## 2022-12-05 MED ORDER — NORMAL SALINE FLUSH 0.9 % IV SOLN
0.9 % | INTRAVENOUS | Status: DC | PRN
Start: 2022-12-05 — End: 2022-12-07
  Administered 2022-12-05 – 2022-12-06 (×3): 10 mL via INTRAVENOUS

## 2022-12-05 MED ORDER — CARBOPROST TROMETHAMINE 250 MCG/ML IM SOLN
250 | Freq: Once | INTRAMUSCULAR | Status: AC | PRN
Start: 2022-12-05 — End: 2022-12-06

## 2022-12-05 MED FILL — FENTANYL-BUPIVACAINE-NACL 0.2-0.1-0.9 MG/100ML-% EP SOLN: EPIDURAL | Qty: 100

## 2022-12-05 MED FILL — OXYCODONE HCL 5 MG PO TABS: 5 MG | ORAL | Qty: 1

## 2022-12-05 MED FILL — IBUPROFEN 400 MG PO TABS: 400 MG | ORAL | Qty: 2

## 2022-12-05 MED FILL — ACETAMINOPHEN EXTRA STRENGTH 500 MG PO TABS: 500 MG | ORAL | Qty: 2

## 2022-12-05 MED FILL — DERMOPLAST 20-0.5 % EX AERO: CUTANEOUS | Qty: 85

## 2022-12-05 MED FILL — OXYTOCIN-SODIUM CHLORIDE 30-0.9 UT/500ML-% IV SOLN: INTRAVENOUS | Qty: 500

## 2022-12-05 MED FILL — DOCUSATE SODIUM 100 MG PO CAPS: 100 MG | ORAL | Qty: 1

## 2022-12-05 NOTE — Anesthesia Post-Procedure Evaluation (Signed)
Department of Anesthesiology  Postprocedure Note    Patient: Michelle Mcintyre  MRN: [id number]  [place]: 12-05-1985  Date of evaluation: 12/05/2022    Procedure Summary       Date: 12/03/22 Room / Location:     Anesthesia Start: 1638 Anesthesia Stop: 12/05/22 0817    Procedure: Labor Analgesia Diagnosis:     Scheduled Providers:  Responsible Provider: Janith Lima, MD    Anesthesia Type: Epidural ASA Status: 3            Anesthesia Type: Epidural    Aldrete Phase I:      Aldrete Phase II:      Anesthesia Post Evaluation    Patient location during evaluation: floor  Patient participation: complete - patient participated  Level of consciousness: awake and alert  Pain score: 1  Airway patency: patent  Nausea & Vomiting: no nausea and no vomiting  Cardiovascular status: hemodynamically stable  Respiratory status: acceptable  Hydration status: euvolemic  Multimodal analgesia pain management approach  Pain management: adequate    No notable events documented.

## 2022-12-05 NOTE — Progress Notes (Addendum)
Assumed care of patient.    0700 - Patient was reassessed and is comfortable.

## 2022-12-05 NOTE — Telephone Encounter (Signed)
----------  DocumentID: [id number] Geoffry [place])-------------------------------------------    [facility]                       Patient Education Report         Name: JENEAN PEREZLOPEZ                  Date: 12/05/2022    MRN: [id number]                    Time: 11:19:48 AM         Patient ordered Safe Sleep Practices    from [id number] at 11:19:48 AM    Synopsis Description: Safe Sleep Practices

## 2022-12-05 NOTE — Progress Notes (Addendum)
0720: Assumed care of patient from M.Juanda Crumble, Therapist, sports.  Patient states she is feeling pressure with contractions.  Repositioned.  0745: SVE 8/90/0; pad changed.  0750: Dr. Pearline Cables notified of patient status.  0800: Patient feeling much more pressure.  SVE: C/C/+1  0805: Foley removed for 300 cc, test push done.  [id number]: Dr. Pearline Cables notified to come for delivery, called for second nurse  0813: Dr. Pearline Cables in room  [phone]: SVD of viable female infant.  [id number]: Placenta.  0820: Repair complete; patient repositioned with ice on perineum.  [id number]: [place] attempting to nurse, difficult to get BP on patient.  0930: Patient given food by family. Nursery in room.  1028: Patient up to bathroom, ambulated well.  Voided moderate amount.  Pericare done.  Transferred to Mother Baby Unit.  1045: Bedside Report to C. Milot, Therapist, sports.

## 2022-12-05 NOTE — Plan of Care (Signed)
Problem: Vaginal Birth or Cesarean Section  Goal: Fetal and maternal status remain reassuring during the birth process  Description:  Birth OB-Pregnancy care plan goal which identifies if the fetal and maternal status remain reassuring during the birth process  Outcome: Completed     Problem: Pain  Goal: Verbalizes/displays adequate comfort level or baseline comfort level  Outcome: Progressing  Flowsheets (Taken 12/05/2022 1037)  Verbalizes/displays adequate comfort level or baseline comfort level:   Encourage patient to monitor pain and request assistance   Assess pain using appropriate pain scale   Administer analgesics based on type and severity of pain and evaluate response   Implement non-pharmacological measures as appropriate and evaluate response   Consider cultural and social influences on pain and pain management     Problem: Infection - Adult  Goal: Absence of infection during hospitalization  Outcome: Progressing  Goal: Absence of fever/infection during anticipated neutropenic period  Outcome: Progressing     Problem: Safety - Adult  Goal: Free from fall injury  Outcome: Progressing  Flowsheets (Taken 12/05/2022 1037)  Free From Fall Injury: Instruct family/caregiver on patient safety     Problem: Skin/Tissue Integrity  Goal: Absence of new skin breakdown  Description: 1.  Monitor for areas of redness and/or skin breakdown  2.  Assess vascular access sites hourly  3.  Every 4-6 hours minimum:  Change oxygen saturation probe site  4.  Every 4-6 hours:  If on nasal continuous positive airway pressure, respiratory therapy assess nares and determine need for appliance change or resting period.  Outcome: Progressing

## 2022-12-05 NOTE — Plan of Care (Signed)
Problem: Pain  Goal: Verbalizes/displays adequate comfort level or baseline comfort level  12/05/2022 1912 by Della Goo, RN  Outcome: Progressing  12/05/2022 1234 by Donnie Coffin, RN  Outcome: Progressing  Flowsheets (Taken 12/05/2022 1037)  Verbalizes/displays adequate comfort level or baseline comfort level:   Encourage patient to monitor pain and request assistance   Assess pain using appropriate pain scale   Administer analgesics based on type and severity of pain and evaluate response   Implement non-pharmacological measures as appropriate and evaluate response   Consider cultural and social influences on pain and pain management     Problem: Infection - Adult  Goal: Absence of infection at discharge  Outcome: Progressing  Goal: Absence of infection during hospitalization  12/05/2022 1912 by Della Goo, RN  Outcome: Progressing  12/05/2022 1234 by Donnie Coffin, RN  Outcome: Progressing  Goal: Absence of fever/infection during anticipated neutropenic period  12/05/2022 1912 by Della Goo, RN  Outcome: Progressing  12/05/2022 1234 by Donnie Coffin, RN  Outcome: Progressing     Problem: Safety - Adult  Goal: Free from fall injury  12/05/2022 1912 by Della Goo, RN  Outcome: Progressing  12/05/2022 1234 by Donnie Coffin, RN  Outcome: Progressing  Flowsheets (Taken 12/05/2022 1037)  Free From Fall Injury: Instruct family/caregiver on patient safety     Problem: Skin/Tissue Integrity  Goal: Absence of new skin breakdown  Description: 1.  Monitor for areas of redness and/or skin breakdown  2.  Assess vascular access sites hourly  3.  Every 4-6 hours minimum:  Change oxygen saturation probe site  4.  Every 4-6 hours:  If on nasal continuous positive airway pressure, respiratory therapy assess nares and determine need for appliance change or resting period.  12/05/2022 1912 by Della Goo, RN  Outcome: Progressing  12/05/2022 1234 by Donnie Coffin, RN  Outcome: Progressing

## 2022-12-06 LAB — CBC
Hematocrit: 28.7 % — ABNORMAL LOW (ref 35.0–47.0)
Hemoglobin: 9.4 gm/dl — ABNORMAL LOW (ref 11.0–16.0)
MCH: 28.1 pg (ref 25.4–34.6)
MCHC: 32.8 gm/dl (ref 30.0–36.0)
MCV: 85.9 fL (ref 80.0–98.0)
MPV: 11.5 fL — ABNORMAL HIGH (ref 6.0–10.0)
Platelets: 218 10*3/uL (ref 140–450)
RBC: 3.34 M/uL — ABNORMAL LOW (ref 3.60–5.20)
RDW: 43.8 (ref 36.4–46.3)
WBC: 13 10*3/uL — ABNORMAL HIGH (ref 4.0–11.0)

## 2022-12-06 MED FILL — DOCUSATE SODIUM 100 MG PO CAPS: 100 MG | ORAL | Qty: 1

## 2022-12-06 MED FILL — OXYCODONE HCL 5 MG PO TABS: 5 MG | ORAL | Qty: 2

## 2022-12-06 MED FILL — SODIUM CHLORIDE FLUSH 0.9 % IV SOLN: 0.9 % | INTRAVENOUS | Qty: 10

## 2022-12-06 MED FILL — IBUPROFEN 400 MG PO TABS: 400 MG | ORAL | Qty: 2

## 2022-12-06 MED FILL — BOOSTRIX 5-2.5-18.5 LF-MCG/0.5 IM SUSY: INTRAMUSCULAR | Qty: 0.5

## 2022-12-06 MED FILL — ACETAMINOPHEN EXTRA STRENGTH 500 MG PO TABS: 500 MG | ORAL | Qty: 2

## 2022-12-06 NOTE — Progress Notes (Signed)
Post-Partum Day Number 1 Progress Note    Patient doing well post-partum without significant complaint.  Voiding without difficulty, normal lochia. Happy    Vitals:    Patient Vitals for the past 24 hrs:   BP Temp Temp src Pulse Resp SpO2   12/06/22 0612 127/81 98.2 F (36.8 C) Oral 70 18 99 %   12/05/22 2119 132/69 98.4 F (36.9 C) Oral 77 18 97 %   12/05/22 1752 125/82 98.6 F (37 C) Oral 97 16 97 %   12/05/22 1440 122/84 98.2 F (36.8 C) Oral 79 16 97 %   12/05/22 1144 -- -- -- -- 18 --   12/05/22 1037 130/86 99.3 F (37.4 C) Oral 95 18 97 %   12/05/22 1027 (!) 141/77 -- -- (!) 105 -- --       Exam:  Patient without distress.               Abdomen soft, fundus firm at level of umbilicus, nontender               Perineum with normal lochia noted.               Lower extremities are negative for swelling, cords or tenderness.    Lab/Data Review:  Reviewed    Assessment and Plan:  PP1 with mild Preeclampsia. Patient appears to be having uncomplicated post-partum course.  Continue routine perineal care and maternal education.  Plan discharge tomorrow if no problems occur.                 Xandra Laramee Bunnie Philips II MD

## 2022-12-07 MED ORDER — FERROUS SULFATE 325 (65 FE) MG PO TABS
325 | ORAL_TABLET | Freq: Every day | ORAL | 2 refills | Status: AC
Start: 2022-12-07 — End: ?

## 2022-12-07 MED ORDER — IBUPROFEN 600 MG PO TABS
600 | ORAL_TABLET | Freq: Four times a day (QID) | ORAL | 1 refills | Status: AC | PRN
Start: 2022-12-07 — End: 2023-01-06

## 2022-12-07 MED FILL — DOCUSATE SODIUM 100 MG PO CAPS: 100 MG | ORAL | Qty: 1

## 2022-12-07 MED FILL — IBUPROFEN 400 MG PO TABS: 400 MG | ORAL | Qty: 2

## 2022-12-07 MED FILL — ACETAMINOPHEN EXTRA STRENGTH 500 MG PO TABS: 500 MG | ORAL | Qty: 2

## 2022-12-07 NOTE — Progress Notes (Signed)
Progress Note    Patient: Michelle Mcintyre MRN: [id number]  SSN: [id number]    Date of Birth: [birth year]  Age: 37 y.o.  Sex: female      Subjective:     Postpartum Day: 2     Delivery: vaginal delivery  Pt denies complaints. Pt denies lightheadedness, dizziness, SOB, RUQ pain, headache, visual changes or chest pain.  Prenatal course noted for mild pre-eclampsia.  Pt with hx of murmur.      Patient feels well. Pain is well controlled with current medications. The patient is ambulating well. Flatus has been passed.  The baby is female. Baby is breast feeding.       Objective:      Patient Vitals for the past 24 hrs:   BP Temp Temp src Pulse Resp SpO2   12/07/22 0613 128/75 98.7 F (37.1 C) Oral 74 18 97 %   12/06/22 2315 116/62 98.8 F (37.1 C) Oral 74 16 97 %   12/06/22 1417 133/79 98.2 F (36.8 C) Oral 91 17 100 %     Temp (24hrs), Avg:98.6 F (37 C), Min:98.2 F (36.8 C), Max:98.8 F (37.1 C)      General: alert, appears stated age, and cooperative   Uterine Fundus:   firm nontender         Lab/Data Review:  Recent Results (from the past 72 hour(s))   CBC    Collection Time: 12/06/22  7:46 AM   Result Value Ref Range    WBC 13.0 (H) 4.0 - 11.0 1000/mm3    RBC 3.34 (L) 3.60 - 5.20 M/uL    Hemoglobin 9.4 (L) 11.0 - 16.0 gm/dl    Hematocrit 28.7 (L) 35.0 - 47.0 %    MCV 85.9 80.0 - 98.0 fL    MCH 28.1 25.4 - 34.6 pg    MCHC 32.8 30.0 - 36.0 gm/dl    Platelets 218 140 - 450 1000/mm3    MPV 11.5 (H) 6.0 - 10.0 fL    RDW 43.8 36.4 - 46.3             Assessment:     Status post: Postpartum vaginal delivery complicated by mild pre-eclampsia.        Plan:   routine post partum care, dc home, follow up 1 weeks or sooner if needed, Call for severe headache, blurred vision or pain in your upper abdomen, nothing in your vagina until after your 6 week follow up visit, and Call the office if you want an IUD or Nexplanon ordered      .    Signed By: Donna Christen, APRN - NP     December 07, 2022    Progress Note

## 2022-12-07 NOTE — Discharge Instructions (Signed)
Learning About Preeclampsia After Childbirth  What is preeclampsia?     Preeclampsia is high blood pressure and signs of organ damage, such as protein in the urine, usually after 20 weeks of pregnancy. If it's not treated, preeclampsia can harm you or your baby.  Severe preeclampsia can lead to dangerous seizures (eclampsia). When preeclampsia affects the liver, it can cause HELLP syndrome, a blood-clotting and bleeding problem. HELLP can come on quickly and can be dangerous. This is why your doctor checks you and your baby often.  Preeclampsia usually goes away after the baby is born. But symptoms may last or get worse after delivery. In rare cases, symptoms may not show up until days or even weeks after childbirth.  What are the symptoms?  Mild preeclampsia usually doesn't cause symptoms. But it may cause rapid weight gain and sudden swelling of the hands and face. Severe preeclampsia can cause symptoms such as a severe headache, vision problems, and trouble breathing. It also can cause belly pain. And you may urinate less than usual.  What can you expect after you've had preeclampsia?  In the hospital  After the baby and the placenta are delivered, preeclampsia usually starts to get better. Most people get better in the first few days after childbirth.  After having preeclampsia, you still have a risk of seizures for a day or more after childbirth. (In very rare cases, seizures happen later on.) So your doctor may have you take magnesium sulfate for a day or more to prevent seizures. You may also take medicine to lower your blood pressure.  When you go home  Your blood pressure will most likely return to normal a few days after delivery. Your doctor will want to check your blood pressure sometime in the first week after you leave the hospital.  High blood pressure sometimes continues after childbirth. But it usually returns to normal levels with time.  Take and record your blood pressure at home if your  doctor tells you to.  Ask your doctor to check your blood pressure monitor to be sure that it is accurate and that the cuff fits you. Also ask your doctor to watch you use it, to make sure that you are using it right.  Don't eat, use tobacco products, or use medicine known to raise blood pressure (such as some nasal decongestant sprays) before you take your blood pressure.  Avoid taking your blood pressure if you have just exercised or if you're nervous or upset. Rest at least 15 minutes before you take your blood pressure.  Take your medicines exactly as prescribed. Call your doctor if you think you are having a problem with your medicine.  If you smoke, try to quit. Talk to your doctor if you need help quitting.  Eat a variety of healthy foods. Include plenty of foods high in calcium, such as dairy products, almonds, and dark leafy greens.  Long-term health  After you've had preeclampsia, you have an increased risk of high blood pressure, heart disease, stroke, and kidney disease. This may be because the same things that cause preeclampsia also cause heart and kidney disease.  To protect your health, work with your doctor on living a heart-healthy lifestyle and getting the checkups you need. Your doctor may also want you to check your blood pressure at home.  Follow-up care is a key part of your treatment and safety. Be sure to make and go to all appointments, and call your doctor if you are having problems.  It's also a good idea to know your test results and keep a list of the medicines you take.  When should you call for help?  Share this information with your partner or a friend. They can help you watch for warning signs.  Call 911  anytime you think you may need emergency care. For example, call if:    You passed out (lost consciousness).     You have a seizure.     You have trouble breathing.     You have chest pain.   Call your doctor now or seek immediate medical care if:    You have symptoms of  preeclampsia, such as:  Sudden swelling of your face, hands, or feet.  New vision problems (such as dimness, blurring, or seeing spots).  A severe headache.     Your blood pressure is very high, such as 160/110 or higher.     Your blood pressure is higher than your doctor told you it should be, or it rises quickly.     You have new nausea or vomiting.     You have pain in your belly or pelvis.     You gain weight rapidly.   Where can you learn more?  Go to https://www.bennett.info/ and enter Q718 to learn more about "Learning About Preeclampsia After Childbirth."  Current as of: March 23, 2022               Content Version: 14.0   2006-2024 [employer], Inc[employer].   Care instructions adapted under license by [facility]. If you have questions about a medical condition or this instruction, always ask your healthcare professional. [place] any warranty or liability for your use of this information.         Postpartum Care at Home With Your Baby: Care Instructions  Overview     After childbirth (postpartum period), your body goes through many changes as you recover. In these weeks after delivery, try to take good care of yourself. Get rest whenever you can and accept help from others.  It may take 4 to 6 weeks to feel like yourself again, and possibly longer if you had a cesarean birth. You may feel sore or very tired as you recover. After delivery, you may continue to have contractions as the uterus returns to the size it was before your pregnancy. You will also have some vaginal bleeding. And you may have pain around the vagina as you heal. Several days after delivery you may also have pain and swelling in your breasts as they fill with milk. There are things you can do at home to help ease these discomforts.  After childbirth, it's common to feel emotional. You may feel irritable, cry easily, and feel happy one minute and sad the next. This is called the "baby blues." Hormone  changes are one cause of these emotional changes. These feelings usually get better within a couple of weeks. If they don't, talk to your doctor or midwife.  In the first couple of weeks after you give birth, your doctor or midwife may want to check in with you and make a plan for follow-up care. You will likely have a complete postpartum visit in the first 3 months after delivery. At that time, your doctor or midwife will check on your recovery and see how you're doing. But if you have questions or concerns before then, you can always call your doctor or midwife.  Follow-up care is a key part of  your treatment and safety. Be sure to make and go to all appointments, and call your doctor if you are having problems. It's also a good idea to know your test results and keep a list of the medicines you take.  How can you care for yourself at home?  Taking care of your body  Use pads instead of tampons for bleeding. After birth, you will have bloody vaginal discharge. You may also pass some blood clots that shouldn't be bigger than an egg. Over the next 6 weeks or so, your bleeding should decrease a little every day and slowly change to a pinkish and then whitish discharge.  For cramps or mild pain, try an over-the-counter pain medicine, such as acetaminophen (Tylenol) or ibuprofen (Advil, Motrin). Read and follow all instructions on the label.  To ease pain around the vagina or from hemorrhoids:  Put ice or a cold pack on the area for 10 to 20 minutes at a time. Put a thin cloth between the ice and your skin.  Try sitting in a few inches of warm water (sitz bath) when you can or after bowel movements.  Clean yourself with a gentle squeeze of warm water from a bottle instead of wiping with toilet paper.  Use witch hazel or hemorrhoid pads (such as Tucks).  Try using a cold compress for sore and swollen breasts. And wear a supportive bra that fits.  Ease constipation by drinking plenty of fluids and eating high-fiber  foods. Ask your doctor or midwife about over-the-counter stool softeners.  Activity  Rest when you can.  Ask for help from family or friends when you need it.  If you can, have another adult in your home for at least 2 or 3 days after birth.  When you feel ready, try to get some exercise every day. For many people, walking is a good choice. Don't do any heavy exercise until your doctor or midwife says it's okay.  Ask your doctor or midwife when it is okay to have vaginal sex.  If you don't want to get pregnant, talk to your doctor or midwife about birth control options. You can get pregnant even before your period returns. Also, you can get pregnant while you are breastfeeding.  Talk to your doctor or midwife if you want to get pregnant again. They can talk to you about when it is safe.  Emotional health  It's normal to have some sadness, anxiety, and mood swings after delivery. It may help to talk with a trusted friend or family member. You can also call the [facility] at 1-833-TLC-MAMA (939) 525-6192) for support. If these mood changes last more than a couple of weeks, talk to your doctor or midwife.  When should you call for help?  Share this information with your partner, family, or a friend. They can help you watch for warning signs.  Call 911  anytime you think you may need emergency care. For example, call if:    You feel you cannot stop from hurting yourself, your baby, or someone else.     You passed out (lost consciousness).     You have chest pain, are short of breath, or cough up blood.     You have a seizure.   Where to get help 24 hours a day, 7 days a week   If you or someone you know talks about suicide, self-harm, a mental health crisis, a substance use crisis, or any other kind of emotional distress,  get help right away. You can:    Call the Suicide and Crisis Lifeline at 21.     Call 1-800-273-TALK 985-691-7004).     Text HOME to 432 082 9057 to access the Crisis Text Line.    Consider saving these numbers in your phone.  Go to 988lifeline.org for more information or to chat online.  Call your doctor or midwife now or seek immediate medical care if:    You have signs of hemorrhage (too much bleeding), such as:  Heavy vaginal bleeding. This means that you are soaking through one or more pads in an hour. Or you pass blood clots bigger than an egg.  Feeling dizzy or lightheaded, or you feel like you may faint.  Feeling so tired or weak that you cannot do your usual activities.  A fast or irregular heartbeat.  New or worse belly pain.     You have signs of infection, such as:  A fever.  Increased pain, swelling, warmth, or redness from an incision or wound.  Frequent or painful urination or blood in your urine.  Vaginal discharge that smells bad.  New or worse belly pain.     You have symptoms of a blood clot in your leg (called a deep vein thrombosis), such as:  Pain in the calf, back of the knee, thigh, or groin.  Swelling in the leg or groin.  A color change on the leg or groin. The skin may be reddish or purplish, depending on your usual skin color.     You have signs of preeclampsia, such as:  Sudden swelling of your face, hands, or feet.  New vision problems (such as dimness, blurring, or seeing spots).  A severe headache.     You have signs of heart failure, such as:  New or increased shortness of breath.  New or worse swelling in your legs, ankles, or feet.  Sudden weight gain, such as more than 2 to 3 pounds in a day or 5 pounds in a week.  Feeling so tired or weak that you cannot do your usual activities.     You had spinal or epidural pain relief and have:  New or worse back pain.  Increased pain, swelling, warmth, or redness at the injection site.  Tingling, weakness, or numbness in your legs or groin.   Watch closely for changes in your health, and be sure to contact your doctor or midwife if:    Your vaginal bleeding isn't decreasing.     You feel sad, anxious, or hopeless for  more than a few days.     You are having problems with your breasts or breastfeeding.   Where can you learn more?  Go to https://www.bennett.info/ and enter Z768 to learn more about "Postpartum Care at Home With Your Baby: Care Instructions."  Current as of: March 23, 2022               Content Version: 14.0   2006-2024 Healthwise, Incorporated.   Care instructions adapted under license by [facility] If you have questions about a medical condition or this instruction, always ask your healthcare professional. [place] any warranty or liability for your use of this information.         Depression After Childbirth: Care Instructions  It's common to lose sleep, feel irritable, and cry easily during the first few days after childbirth. Hormone changes and the demands of a new baby can cause these "baby blues." If these mood changes last  more than 2 weeks, you may have postpartum depression. This is a medical condition that requires treatment.  If you have any of these signs, you may be depressed. See your doctor right away.    You feel very sad or hopeless and lose interest in daily activities.       You sleep too much or not enough.       You feel tired or as if you have no energy.       You eat too much or too little.       You write or talk about death.     Tips to help with postpartum depression    What you can do    Try to go to all of your counseling sessions.  Take medicines as directed.  Eat healthy foods.  Get daily exercise, such as walks.  Try to get some sunlight every day.  Avoid using alcohol or other substances.  Get as much rest as possible.  Connect with friends, and join a support group for new parents.  When should you call for help?   Call 911 if:    You feel you cannot stop from hurting yourself, your baby, or someone else.   Where to get help 24 hours a day, 7 days a week   If you or someone you know talks about suicide, self-harm, a mental health crisis, a  substance use crisis, or any other kind of emotional distress, get help right away. You can:    Call the Suicide and Crisis Lifeline at 85.     Call 1-800-273-TALK (470) 803-1182).     Text HOME to 650-593-1594 to access the Crisis Text Line.   Consider saving these numbers in your phone.  Go to 988lifeline.org for more information or to chat online.  Call your doctor now or seek immediate medical care if:    You are having trouble caring for yourself or your baby.     You hear voices.   Contact your doctor if:    You have problems with your medicines.     You do not get better as expected.   Follow-up care is a key part of your treatment and safety. Be sure to make and go to all appointments, and call your doctor if you are having problems. It's also a good idea to know your test results and keep a list of the medicines you take.  Where can you learn more?  Go to https://www.bennett.info/ and enter [id number] to learn more about "Depression After Childbirth: Care Instructions."  Current as of: March 07, 2022               Content Version: 14.0   2006-2024 Healthwise, Incorporated.   Care instructions adapted under license by [facility]. If you have questions about a medical condition or this instruction, always ask your healthcare professional. [place] any warranty or liability for your use of this information.

## 2022-12-07 NOTE — Progress Notes (Signed)
Discharge teaching reviewed. All questions answered. Prescriptions sent per MD to pharmacy.

## 2023-07-15 ENCOUNTER — Emergency Department: Admit: 2023-07-15 | Payer: MEDICAID | Primary: Diagnostic Radiology

## 2023-07-15 ENCOUNTER — Inpatient Hospital Stay
Admit: 2023-07-15 | Discharge: 2023-07-15 | Disposition: A | Discharge status: Home / Self Care | Payer: MEDICAID | Attending: Emergency Medicine

## 2023-07-15 DIAGNOSIS — J189 Pneumonia, unspecified organism: Secondary | ICD-10-CM

## 2023-07-15 LAB — COMPREHENSIVE METABOLIC PANEL
ALT: 83 U/L — ABNORMAL HIGH (ref 10–49)
AST: 49 U/L — ABNORMAL HIGH (ref 0.0–33.9)
Albumin: 3.5 g/dL (ref 3.4–5.0)
Alkaline Phosphatase: 121 U/L — ABNORMAL HIGH (ref 46–116)
Anion Gap: 10 mmol/L (ref 5–15)
BUN: 7 mg/dL — ABNORMAL LOW (ref 9–23)
CO2: 23 meq/L (ref 20–31)
Calcium: 9.7 mg/dL (ref 8.7–10.4)
Chloride: 102 meq/L (ref 98–107)
Creatinine: 0.86 mg/dL (ref 0.55–1.02)
GFR African American: >60
GFR Non-African American: >60
Glucose: 174 mg/dL — ABNORMAL HIGH (ref 74–106)
Potassium: 3.4 meq/L — ABNORMAL LOW (ref 3.5–5.1)
Sodium: 135 meq/L — ABNORMAL LOW (ref 136–145)
Total Bilirubin: 0.5 mg/dL (ref 0.30–1.20)
Total Protein: 7.8 g/dL (ref 5.7–8.2)

## 2023-07-15 LAB — EKG 12-LEAD
Atrial Rate: 100 {beats}/min
Calculated P Axis: 73 degrees
Calculated R Axis: 78 degrees
Calculated T Axis: 69 degrees
DIAGNOSIS, 93000: NORMAL
P-R Interval: 160 ms
Q-T Interval: 348 ms
QRS Duration: 104 ms
QTC Calculation (Bezet): 448 ms
Ventricular Rate: 100 {beats}/min

## 2023-07-15 LAB — POCT URINALYSIS DIPSTICK
Glucose, Ur: 100 mg/dL — AB
Leukocyte Esterase, Urine: NEGATIVE
Nitrite, Urine: NEGATIVE
Protein, Urine: >=300 mg/dL — AB
Specific Gravity, UA: >=1.03 (ref 1.005–1.030)
Urobilinogen, Urine: 4 U/dL — ABNORMAL HIGH (ref 0.0–1.0)
pH, Urine: 6 (ref 5–9)

## 2023-07-15 LAB — COVID-19, FLU A/B, AND RSV COMBO
Influenza A H1 (Seasonal) PCR: NEGATIVE
Influenza virus B RNA: NEGATIVE
RSV A/B PCR: NEGATIVE
SARS-CoV-2: NEGATIVE

## 2023-07-15 LAB — CBC WITH AUTO DIFFERENTIAL
Basophils: 0.3 % (ref 0–3)
Eosinophils: 0.3 % (ref 0–5)
Hematocrit: 39.6 % (ref 35.0–47.0)
Hemoglobin: 12.4 g/dL (ref 11.0–16.0)
Immature Granulocytes %: 0.3 % (ref 0.0–3.0)
Lymphocytes: 7.6 % — ABNORMAL LOW (ref 28–48)
MCH: 24.7 pg — ABNORMAL LOW (ref 25.4–34.6)
MCHC: 31.3 g/dL (ref 30.0–36.0)
MCV: 78.7 fL — ABNORMAL LOW (ref 80.0–98.0)
MPV: 11.4 fL — ABNORMAL HIGH (ref 6.0–10.0)
Monocytes: 3.2 % (ref 1–13)
Neutrophils Segmented: 88.3 % — ABNORMAL HIGH (ref 34–64)
Nucleated RBCs: 0 (ref 0–0)
Platelets: 244 10*3/uL (ref 140–450)
RBC: 5.03 M/uL (ref 3.60–5.20)
RDW: 45.7 (ref 36.4–46.3)
WBC: 11.7 10*3/uL — ABNORMAL HIGH (ref 4.0–11.0)

## 2023-07-15 LAB — POC PREGNANCY UR-QUAL: Pregnancy, Urine: NEGATIVE

## 2023-07-15 LAB — TROPONIN
Troponin, High Sensitivity: <3 ng/L (ref 0–34)
Troponin, High Sensitivity: <3 ng/L (ref 0–34)

## 2023-07-15 LAB — LACTIC ACID: Lactate: 1.6 mmol/L (ref 0.5–2.2)

## 2023-07-15 LAB — PROCALCITONIN: Procalcitonin: 0.19 ng/mL (ref 0.00–0.50)

## 2023-07-15 MED ORDER — HYDROCOD POLI-CHLORPHE POLI ER 10-8 MG/5ML PO SUER
10-8 | Freq: Two times a day (BID) | ORAL | 0 refills | Status: AC | PRN
Start: 2023-07-15 — End: 2023-07-18

## 2023-07-15 MED ORDER — AZITHROMYCIN 250 MG PO TABS
250 | Freq: Once | ORAL | Status: AC
Start: 2023-07-15 — End: 2023-07-15
  Administered 2023-07-15: 17:00:00 500 mg via ORAL

## 2023-07-15 MED ORDER — CEFTRIAXONE SODIUM 1 G IJ SOLR
1 | INTRAMUSCULAR | Status: AC
Start: 2023-07-15 — End: 2023-07-15
  Administered 2023-07-15: 17:00:00 1000 mg via INTRAVENOUS

## 2023-07-15 MED ORDER — AMOXICILLIN-POT CLAVULANATE 875-125 MG PO TABS
875-125 | ORAL_TABLET | Freq: Two times a day (BID) | ORAL | 0 refills | Status: AC
Start: 2023-07-15 — End: 2023-07-22

## 2023-07-15 MED ORDER — ONDANSETRON HCL 4 MG/2ML IJ SOLN
4 | Freq: Once | INTRAMUSCULAR | Status: AC
Start: 2023-07-15 — End: 2023-07-15
  Administered 2023-07-15: 19:00:00 4 mg via INTRAVENOUS

## 2023-07-15 MED ORDER — IOPAMIDOL 61 % IV SOLN
61 | Freq: Once | INTRAVENOUS | Status: AC | PRN
Start: 2023-07-15 — End: 2023-07-15
  Administered 2023-07-15: 19:00:00 80 mL via INTRAVENOUS

## 2023-07-15 MED ORDER — ALBUTEROL SULFATE HFA 108 (90 BASE) MCG/ACT IN AERS
108 | Freq: Four times a day (QID) | RESPIRATORY_TRACT | 0 refills | Status: AC | PRN
Start: 2023-07-15 — End: ?

## 2023-07-15 MED ORDER — AZITHROMYCIN 250 MG PO TABS
250 | ORAL_TABLET | ORAL | 0 refills | Status: AC
Start: 2023-07-15 — End: 2023-07-25

## 2023-07-15 MED ORDER — ACETAMINOPHEN 500 MG PO TABS
500 | ORAL | Status: AC
Start: 2023-07-15 — End: 2023-07-15
  Administered 2023-07-15: 18:00:00 1000 mg via ORAL

## 2023-07-15 MED ORDER — SODIUM CHLORIDE 0.9 % IV SOLN
0.9 | Freq: Once | INTRAVENOUS | Status: DC
Start: 2023-07-15 — End: 2023-07-15

## 2023-07-15 MED FILL — AZITHROMYCIN 250 MG PO TABS: 250 MG | ORAL | Qty: 2

## 2023-07-15 MED FILL — ACETAMINOPHEN EXTRA STRENGTH 500 MG PO TABS: 500 MG | ORAL | Qty: 2

## 2023-07-15 MED FILL — CEFTRIAXONE SODIUM 1 G IJ SOLR: 1 g | INTRAMUSCULAR | Qty: 1000

## 2023-07-15 MED FILL — ONDANSETRON HCL 4 MG/2ML IJ SOLN: 4 MG/2ML | INTRAMUSCULAR | Qty: 2

## 2023-07-15 MED FILL — ISOVUE-300 61 % IV SOLN: 61 % | INTRAVENOUS | Qty: 80

## 2023-07-15 MED FILL — AZITHROMYCIN 500 MG IV SOLR: 500 MG | INTRAVENOUS | Qty: 250

## 2023-07-15 NOTE — Discharge Instructions (Signed)
Take medications as prescribed. Return with new or worsening symptoms. Call to schedule follow up with primary care doctor when possible.    Results for orders placed or performed during the hospital encounter of 07/15/23   COVID-19, Flu A/B, and RSV Combo    Specimen: Nasopharyngeal Swab   Result Value Ref Range    SARS-CoV-2 NEGATIVE NEGATIVE      Influenza A H1 (Seasonal) PCR NEGATIVE NEGATIVE      Influenza virus B RNA NEGATIVE NEGATIVE      RSV A/B PCR NEGATIVE NEGATIVE     XR CHEST (2 VW)    Narrative    Clinical history: Cough, chest pain    EXAMINATION: PA and lateral views of the chest 07/15/2023    Correlation: None    FINDINGS:    Trachea and heart size are within normal limits. Right middle lobe and left  lower lobe pneumonia. Radiographic follow-up to resolution suggested.      Impression    IMPRESSION:  Right middle and left lower lobe pneumonia.    Electronically signed by: Jolayne Panther, MD 07/15/2023 9:40 AM EDT            Workstation ID: [id number]     CT Chest Pulmonary Embolism W Contrast    Narrative    CTA OF THE CHEST     INDICATION: Chest Pain, shortness of breath, multifocal pneumonia on chest x-ray  COMPARISON: Chest x-ray performed the same day    TECHNIQUE: CTA of the chest was performed with intravenous contrast. Multiplanar  coronal and sagittal and 3-dimensional maximum intensity projection  reconstructions were performed and reviewed.    All CT exams at this facility use one or more dose reduction techniques  including automatic exposure control, mA/kV adjustment per patient's size, or  iterative reconstruction technique.    FINDINGS:    AIRWAYS: No endobronchial mass    PULMONARY: Multifocal consolidation noted within the right upper, right middle,  and left lower lobes. No pleural effusion or pneumothorax.    HEART: The heart is not enlarged. There is no pericardial effusion.   Coronary  artery calcifications are absent.    VASCULAR: Negative for pulmonary embolism and aortic  dissection.    MEDIASTINUM: Enlarged 1.5 cm subcarinal lymph node. Enlarged bilateral hilar  lymph nodes measuring up to 1.1 cm. Subcentimeter mediastinal lymph nodes.    UPPER ABDOMEN: No acute findings.    BONES: Acutely intact.       Impression    IMPRESSION:    1.  Multifocal infiltrates, likely infectious versus inflammatory.  2.  Mediastinal and hilar lymphadenopathy, likely reactive. Consider follow-up.    Electronically signed by: Jodi Mourning, MD 07/15/2023 3:20 PM EDT            Workstation ID: [id number]     Comprehensive Metabolic Panel   Result Value Ref Range    Potassium 3.4 (L) 3.5 - 5.1 mEq/L    Chloride 102 98 - 107 mEq/L    Sodium 135 (L) 136 - 145 mEq/L    CO2 23 20 - 31 mEq/L    Glucose 174 (H) 74 - 106 mg/dl    BUN 7 (L) 9 - 23 mg/dl    Creatinine 8.46 9.62 - 1.02 mg/dl    GFR African American >60.0      GFR Non-African American >60      Calcium 9.7 8.7 - 10.4 mg/dl    Anion Gap 10 5 - 15 mmol/L    AST 49.0 (H)  0.0 - 33.9 U/L    ALT 83 (H) 10 - 49 U/L    Alkaline Phosphatase 121 (H) 46 - 116 U/L    Total Bilirubin 0.50 0.30 - 1.20 mg/dl    Total Protein 7.8 5.7 - 8.2 gm/dl    Albumin 3.5 3.4 - 5.0 gm/dl   CBC with Auto Differential   Result Value Ref Range    WBC 11.7 (H) 4.0 - 11.0 1000/mm3    RBC 5.03 3.60 - 5.20 M/uL    Hemoglobin 12.4 11.0 - 16.0 gm/dl    Hematocrit 16.1 09.6 - 47.0 %    MCV 78.7 (L) 80.0 - 98.0 fL    MCH 24.7 (L) 25.4 - 34.6 pg    MCHC 31.3 30.0 - 36.0 gm/dl    Platelets 045 409 - 450 1000/mm3    MPV 11.4 (H) 6.0 - 10.0 fL    RDW 45.7 36.4 - 46.3      Nucleated RBCs 0 0 - 0      Immature Granulocytes % 0.3 0.0 - 3.0 %    Neutrophils Segmented 88.3 (H) 34 - 64 %    Lymphocytes 7.6 (L) 28 - 48 %    Monocytes 3.2 1 - 13 %    Eosinophils 0.3 0 - 5 %    Basophils 0.3 0 - 3 %   Troponin   Result Value Ref Range    Troponin, High Sensitivity <3 0 - 34 ng/L   Troponin   Result Value Ref Range    Troponin, High Sensitivity <3 0 - 34 ng/L   Lactic Acid   Result Value Ref Range     Lactate 1.6 0.5 - 2.2 mmol/L   Procalcitonin   Result Value Ref Range    Procalcitonin 0.19 0.00 - 0.50 ng/ml   POCT Urinalysis no Micro   Result Value Ref Range    Glucose, Ur 100 (A) NEGATIVE,Negative mg/dl    Bilirubin, Urine Small (A) NEGATIVE,Negative      Ketones, Urine Trace (A) NEGATIVE,Negative mg/dl    Specific Gravity, UA >=1.030 1.005 - 1.030      Blood, Urine Trace-intact (A) NEGATIVE,Negative      pH, Urine 6.0 5 - 9      Protein, Urine >=300 (A) NEGATIVE,Negative mg/dl    Urobilinogen, Urine 4.0 (H) 0.0 - 1.0 EU/dl    Nitrite, Urine Negative NEGATIVE,Negative      Leukocyte Esterase, Urine Negative NEGATIVE,Negative      Color, UA Amber      Clarity, UA Cloudy     POC Pregnancy Urine Qual   Result Value Ref Range    Pregnancy, Urine negative NEGATIVE,Negative,negative     EKG 12 Lead arrival to provider within 10 mins   Result Value Ref Range    Ventricular Rate 100 BPM    Atrial Rate 100 BPM    P-R Interval 160 ms    QRS Duration 104 ms    Q-T Interval 348 ms    QTC Calculation (Bezet) 448 ms    Calculated P Axis 73 degrees    Calculated R Axis 78 degrees    Calculated T Axis 69 degrees    DIAGNOSIS, 93000       Normal sinus rhythm  Right atrial enlargement  Incomplete right bundle branch block  Borderline ECG  When compared with ECG of 25-Aug-2010 17:48,  No significant change was found  Confirmed by Lauree Chandler ([id number]) on 07/15/2023 3:01:35 PM

## 2023-07-15 NOTE — ED Notes (Signed)
Patient stated she has new incontinence that started last night, stating she feels like she doesn't get the sensation to go then empties her whole bladder. She also stated she has been running a fever of 103, stated she took tylenol and ibuprofen this morning at 7am but has taken a long time for the fever to finally break. Patient had a bakers cyst behind her right drained last Thursday      Michelle Mcintyre  07/15/23 1610

## 2023-07-15 NOTE — ED Triage Notes (Signed)
Received patient in triage with c/o cough, chest pain and congestion x 3 days. 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

## 2023-07-15 NOTE — ED Notes (Signed)
Ambulated pt, O2 sat stayed above 98%     Nickola Major, LPN  69/62/95 [id number]

## 2023-07-15 NOTE — ED Provider Notes (Signed)
[facility]  Emergency Department Treatment Report    Patient: Michelle Mcintyre Age: 37 y.o. Sex: female    Date of Birth: 06/25/1986 Admit Date: 07/15/2023 PCP: Unknown, Provider, MD   MRN: [id number]  CSN: [id number]  ATTENDING: Algis Downs, MD    Room: H07/H07 Time Dictated: 5:01 PM APP: Merian Capron, PA-C     Chief Complaint   Chief Complaint   Patient presents with    Cough    Nasal Congestion       History of Present Illness   37 y.o. female without significant PMHx presents with complaints of dyspnea, cough, thoracic discomfort and fever than has been present for 3 days.  Pt reports she developed a fever with chills Monday night and started to have a cough, dyspnea with light exertion, and thoracic discomfort with deep breaths Tuesday. She reports a fever of 103.3. [facility] and was given Tamiflu without relief of symptoms. She states she developed urinary incontinence last night and had to put on a diaper; she has never had this before. She has been taking OTC tylenol and ibuprofen with some relief. 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     Review of Systems   See HPI    Past Medical/Surgical History     Past Medical History:   Diagnosis Date    AMA (advanced maternal age) multigravida 35+     Heart murmur     Pre-eclampsia     Subchorionic hematoma in first trimester      Past Surgical History:   Procedure Laterality Date    ECTOPIC PREGNANCY SURGERY  2009       Social History     Social History     Socioeconomic History    Marital status: Married   Tobacco Use    Smoking status: Former     Current packs/day: 2.00     Types: Cigarettes    Smokeless tobacco: Former   Substance and Sexual Activity    Alcohol use: Not Currently    Drug use: Never    Sexual activity: Yes     Social Determinants of Health     Financial Resource Strain: Low Risk   (12/02/2022)    Overall Financial Resource Strain (CARDIA)     Difficulty of Paying Living Expenses: Not hard at all   Food Insecurity: No Food Insecurity (12/02/2022)    Hunger Vital Sign     Worried About Running Out of Food in the Last Year: Never true     Ran Out of Food in the Last Year: Never true   Transportation Needs: No Transportation Needs (12/02/2022)    PRAPARE - Therapist, art (Medical): No     Lack of Transportation (Non-Medical): No   Physical Activity: Inactive (12/02/2022)    Exercise Vital Sign     Days of Exercise per Week: 0 days     Minutes of Exercise per Session: 0 min   Stress: No Stress Concern Present (12/02/2022)    [employer] of Occupational Health - Occupational Stress Questionnaire     Feeling of Stress : Not at all   Social Connections: Moderately Integrated (12/02/2022)    Social Connection and Isolation Panel [NHANES]     Frequency of Communication with Friends and Family: More than three times a  week     Frequency of Social Gatherings with Friends and Family: More than three times a week     Attends Religious Services: More than 4 times per year     Active Member of [employer] or Organizations: No     Attends Banker Meetings: Never     Marital Status: Married   Catering manager Violence: Not At Risk (12/02/2022)    Humiliation, Afraid, Rape, and Kick questionnaire     Fear of Current or Ex-Partner: No     Emotionally Abused: No     Physically Abused: No     Sexually Abused: No   Housing Stability: Low Risk  (12/02/2022)    Housing Stability Vital Sign     Unable to Pay for Housing in the Last Year: No     Number of Places Lived in the Last Year: 2     Unstable Housing in the Last Year: No       Family History   No family history on file.  No pertinent family history.  Current Medications     Discharge Medication List as of 07/15/2023  3:48 PM        CONTINUE these medications which have NOT CHANGED    Details   ibuprofen (ADVIL;MOTRIN) 600 MG  tablet Take 1 tablet by mouth every 6 hours as needed for Pain, Disp-60 tablet, R-1Normal      ferrous sulfate (IRON 325) 325 (65 Fe) MG tablet Take 1 tablet by mouth daily (with breakfast), Disp-30 tablet, R-2Normal      Prenatal MV-Min-Fe Fum-FA-DHA (PRENATAL 1 PO) Take by mouthHistorical Med               Allergies   Patient has no known allergies.      Physical Exam     ED Triage Vitals [07/15/23 0900]   BP Systolic BP Percentile Diastolic BP Percentile Temp Temp Source Pulse Respirations SpO2   (!) 156/75 -- -- 98.1 F (36.7 C) Oral (!) 103 22 100 %      Height Weight         -- --               Constitutional: Patient appears well developed and well nourished. Appearance and behavior are age and situation appropriate.  HEENT: Conjunctiva clear.  Mucous membranes moist, non-erythematous. Surface of the pharynx, palate, and tongue are pink, moist and without lesions.  Neck: supple, non tender, symmetrical, no masses or lymphadenopathy   Respiratory: lungs clear to auscultation, nonlabored respirations. No tachypnea or accessory muscle use.  Cardiovascular: heart tachycardic but regular without murmur rubs or gallops.     Gastrointestinal:  Soft, non-tender, non-distended, normoactive bowel sounds  Musculoskeletal: Calves soft and non-tender. No peripheral edema or significant varicosities.  Pulses:  radial and DP pulses 2+ and equal bilaterally.   Integumentary: warm and dry, no jaundice, no rashes or lesions  Neurologic: alert and oriented, no focal weakness.     Impression and Management Plan   37 year old female presenting to the emergency department for evaluation of cough with fever, shortness of breath, thoracic back and chest pain.  On exam, patient is tachycardic 103 O2 saturation 100% on room air, mildly hypertensive 156/75 other vitals are normal.  Heart is tachycardic but regular lungs are clear.  Will obtain basic labs chest x-ray EKG, procalcitonin, lactic acid, CTA chest for further  evaluation.  DDx: Including but not limited to pneumonia, PE  Diagnostic Studies  Results for orders placed or performed during the hospital encounter of 07/15/23   COVID-19, Flu A/B, and RSV Combo    Specimen: Nasopharyngeal Swab   Result Value Ref Range    SARS-CoV-2 NEGATIVE NEGATIVE      Influenza A H1 (Seasonal) PCR NEGATIVE NEGATIVE      Influenza virus B RNA NEGATIVE NEGATIVE      RSV A/B PCR NEGATIVE NEGATIVE     XR CHEST (2 VW)    Narrative    Clinical history: Cough, chest pain    EXAMINATION: PA and lateral views of the chest 07/15/2023    Correlation: None    FINDINGS:    Trachea and heart size are within normal limits. Right middle lobe and left  lower lobe pneumonia. Radiographic follow-up to resolution suggested.      Impression    IMPRESSION:  Right middle and left lower lobe pneumonia.    Electronically signed by: Jolayne Panther, MD 07/15/2023 9:40 AM EDT            Workstation ID: [id number]     CT Chest Pulmonary Embolism W Contrast    Narrative    CTA OF THE CHEST     INDICATION: Chest Pain, shortness of breath, multifocal pneumonia on chest x-ray  COMPARISON: Chest x-ray performed the same day    TECHNIQUE: CTA of the chest was performed with intravenous contrast. Multiplanar  coronal and sagittal and 3-dimensional maximum intensity projection  reconstructions were performed and reviewed.    All CT exams at this facility use one or more dose reduction techniques  including automatic exposure control, mA/kV adjustment per patient's size, or  iterative reconstruction technique.    FINDINGS:    AIRWAYS: No endobronchial mass    PULMONARY: Multifocal consolidation noted within the right upper, right middle,  and left lower lobes. No pleural effusion or pneumothorax.    HEART: The heart is not enlarged. There is no pericardial effusion.   Coronary  artery calcifications are absent.    VASCULAR: Negative for pulmonary embolism and aortic dissection.    MEDIASTINUM: Enlarged 1.5 cm subcarinal lymph  node. Enlarged bilateral hilar  lymph nodes measuring up to 1.1 cm. Subcentimeter mediastinal lymph nodes.    UPPER ABDOMEN: No acute findings.    BONES: Acutely intact.       Impression    IMPRESSION:    1.  Multifocal infiltrates, likely infectious versus inflammatory.  2.  Mediastinal and hilar lymphadenopathy, likely reactive. Consider follow-up.    Electronically signed by: Jodi Mourning, MD 07/15/2023 3:20 PM EDT            Workstation ID: [id number]     Comprehensive Metabolic Panel   Result Value Ref Range    Potassium 3.4 (L) 3.5 - 5.1 mEq/L    Chloride 102 98 - 107 mEq/L    Sodium 135 (L) 136 - 145 mEq/L    CO2 23 20 - 31 mEq/L    Glucose 174 (H) 74 - 106 mg/dl    BUN 7 (L) 9 - 23 mg/dl    Creatinine 5.62 1.30 - 1.02 mg/dl    GFR African American >60.0      GFR Non-African American >60      Calcium 9.7 8.7 - 10.4 mg/dl    Anion Gap 10 5 - 15 mmol/L    AST 49.0 (H) 0.0 - 33.9 U/L    ALT 83 (H) 10 - 49 U/L    Alkaline Phosphatase 121 (H) 46 - 116  U/L    Total Bilirubin 0.50 0.30 - 1.20 mg/dl    Total Protein 7.8 5.7 - 8.2 gm/dl    Albumin 3.5 3.4 - 5.0 gm/dl   CBC with Auto Differential   Result Value Ref Range    WBC 11.7 (H) 4.0 - 11.0 1000/mm3    RBC 5.03 3.60 - 5.20 M/uL    Hemoglobin 12.4 11.0 - 16.0 gm/dl    Hematocrit 54.0 98.1 - 47.0 %    MCV 78.7 (L) 80.0 - 98.0 fL    MCH 24.7 (L) 25.4 - 34.6 pg    MCHC 31.3 30.0 - 36.0 gm/dl    Platelets 191 478 - 450 1000/mm3    MPV 11.4 (H) 6.0 - 10.0 fL    RDW 45.7 36.4 - 46.3      Nucleated RBCs 0 0 - 0      Immature Granulocytes % 0.3 0.0 - 3.0 %    Neutrophils Segmented 88.3 (H) 34 - 64 %    Lymphocytes 7.6 (L) 28 - 48 %    Monocytes 3.2 1 - 13 %    Eosinophils 0.3 0 - 5 %    Basophils 0.3 0 - 3 %   Troponin   Result Value Ref Range    Troponin, High Sensitivity <3 0 - 34 ng/L   Troponin   Result Value Ref Range    Troponin, High Sensitivity <3 0 - 34 ng/L   Lactic Acid   Result Value Ref Range    Lactate 1.6 0.5 - 2.2 mmol/L   Procalcitonin   Result Value Ref  Range    Procalcitonin 0.19 0.00 - 0.50 ng/ml   POCT Urinalysis no Micro   Result Value Ref Range    Glucose, Ur 100 (A) NEGATIVE,Negative mg/dl    Bilirubin, Urine Small (A) NEGATIVE,Negative      Ketones, Urine Trace (A) NEGATIVE,Negative mg/dl    Specific Gravity, UA >=1.030 1.005 - 1.030      Blood, Urine Trace-intact (A) NEGATIVE,Negative      pH, Urine 6.0 5 - 9      Protein, Urine >=300 (A) NEGATIVE,Negative mg/dl    Urobilinogen, Urine 4.0 (H) 0.0 - 1.0 EU/dl    Nitrite, Urine Negative NEGATIVE,Negative      Leukocyte Esterase, Urine Negative NEGATIVE,Negative      Color, UA Amber      Clarity, UA Cloudy     POC Pregnancy Urine Qual   Result Value Ref Range    Pregnancy, Urine negative NEGATIVE,Negative,negative     EKG 12 Lead arrival to provider within 10 mins   Result Value Ref Range    Ventricular Rate 100 BPM    Atrial Rate 100 BPM    P-R Interval 160 ms    QRS Duration 104 ms    Q-T Interval 348 ms    QTC Calculation (Bezet) 448 ms    Calculated P Axis 73 degrees    Calculated R Axis 78 degrees    Calculated T Axis 69 degrees    DIAGNOSIS, 93000       Normal sinus rhythm  Right atrial enlargement  Incomplete right bundle branch block  Borderline ECG  When compared with ECG of 25-Aug-2010 17:48,  No significant change was found  Confirmed by Lauree Chandler ([id number]) on 07/15/2023 3:01:35 PM         ECG: Interpreted by myself and Algis Downs, MD. My interpretation is normal sinus rhythm, ventricular rate 100, PR interval 160, QRS 104,  QTc 448.  Incomplete right bundle branch block.  No acute ischemic changes noted.    Imaging studies independently interpreted by myself and Algis Downs, MD. My interpretation is chest x-ray shows multifocal pneumonia  ED Course/Medical Decision Making   Patient with leukocytosis white blood cell count is 11.7, H&H 12.4 and 39.6, platelets 244, left shift no bands.  Electrolytes gross normal, renal functions normal, creatinine 0.86 with a BUN less than 7.  Alk phos  mildly elevated 121, ALT AST mildly elevated 83 elevated 49.  High-sensitivity troponin undetectable x 2.  Pro-Cal lactate within normal limits.  COVID flu negative.  Urinalysis with no evidence of infection.  Chest x-ray showing multifocal pneumonia, CT of the chest shows multifocal infiltrates with mediastinal and hilar lymphadenopathy no PE.    Patient was ambulated with pulse ox, maintained oxygen saturation greater than 97% at all times.  I think she be safely discharged home for outpatient therapy with close return precautions and she is comfortable with this.  She was given dose of Rocephin and Zithromax here, she was given prescriptions for Augmentin with Zithromax at home along with test and oxygen and albuterol inhaler with instructions to return with new or worsening symptoms and to follow-up closely with her primary care physician.  She is comfortable with this plan.  Was discharged in good condition.        RECORDS REVIEWED:  I reviewed the patient's previous records here at [facility] and available outside facilities and note that patient was seen most recently in the ED at [facility] emergency department August 1 diagnosed with a Baker's cyst which was drained in office 2 weeks ago.      INDEPENDENT HISTORIAN:  History and/or plan development assisted by: None    Severe exacerbation or progression of chronic illness: None      Threat to body function without evaluation and management: Infectious, respiratory      SOCIAL DETERMINANTS  impacting Evaluation and Management: Provider availability, stress, health literacy      Comorbidities impacting Evaluation and Management: None    Medications   cefTRIAXone (ROCEPHIN) 1,000 mg in sodium chloride 0.9 % 50 mL IVPB (mini-bag) (0 mg IntraVENous Stopped 07/15/23 1420)   azithromycin (ZITHROMAX) tablet 500 mg (500 mg Oral Given 07/15/23 1247)   acetaminophen (TYLENOL) tablet 1,000 mg (1,000 mg Oral Given 07/15/23 1420)   ondansetron (ZOFRAN) injection 4 mg  (4 mg IntraVENous Given 07/15/23 1505)   iopamidol (ISOVUE-300) 61 % injection 80 mL (80 mLs IntraVENous Given 07/15/23 1501)     Final Diagnosis     1. Multifocal pneumonia        Disposition   Discharged home    [place], [place]  July 15, 2023      The patient was personally evaluated by myself and discussed with Algis Downs, MD who agrees with the above assessment and plan.    My signature above authenticates this document and my orders, the final diagnosis (es), discharge prescription (s), and instructions in the Epic record. If you have any questions please contact [phone].     Nursing notes have been reviewed by the physician/ advanced practice clinician.    Dragon medical dictation software was used for portions of this report. Unintended voice recognition errors may occur.             Merian Capron, [place]  07/15/23 [phone]

## 2023-07-15 NOTE — ED Notes (Signed)
Ct informed this nurse that pt vomited after contrast     PA notified and orders placed      Nickola Major, LPN  16/10/96 0454
